# Patient Record
Sex: Female | Born: 1940 | Race: White | Hispanic: No | Smoking: Former smoker
Health system: Southern US, Community
[De-identification: ages and names within clinical notes are randomized; demographics above are authoritative.]

## PROBLEM LIST (undated history)

## (undated) DIAGNOSIS — E079 Disorder of thyroid, unspecified: Secondary | ICD-10-CM

## (undated) DIAGNOSIS — E785 Hyperlipidemia, unspecified: Secondary | ICD-10-CM

## (undated) DIAGNOSIS — I10 Essential (primary) hypertension: Secondary | ICD-10-CM

## (undated) HISTORY — DX: Hyperlipidemia, unspecified: E78.5

## (undated) HISTORY — DX: Disorder of thyroid, unspecified: E07.9

## (undated) HISTORY — PX: EYE SURGERY: SHX253

## (undated) HISTORY — DX: Essential (primary) hypertension: I10

## (undated) HISTORY — PX: APPENDECTOMY: SHX54

---

## 2013-09-12 DIAGNOSIS — M722 Plantar fascial fibromatosis: Secondary | ICD-10-CM | POA: Diagnosis not present

## 2013-09-12 DIAGNOSIS — I10 Essential (primary) hypertension: Secondary | ICD-10-CM | POA: Diagnosis not present

## 2013-09-12 DIAGNOSIS — E039 Hypothyroidism, unspecified: Secondary | ICD-10-CM | POA: Diagnosis not present

## 2013-09-12 DIAGNOSIS — I6529 Occlusion and stenosis of unspecified carotid artery: Secondary | ICD-10-CM | POA: Diagnosis not present

## 2013-09-15 DIAGNOSIS — I658 Occlusion and stenosis of other precerebral arteries: Secondary | ICD-10-CM | POA: Diagnosis not present

## 2013-09-15 DIAGNOSIS — I771 Stricture of artery: Secondary | ICD-10-CM | POA: Diagnosis not present

## 2013-09-15 DIAGNOSIS — I6529 Occlusion and stenosis of unspecified carotid artery: Secondary | ICD-10-CM | POA: Diagnosis not present

## 2013-09-23 DIAGNOSIS — H401290 Low-tension glaucoma, unspecified eye, stage unspecified: Secondary | ICD-10-CM | POA: Diagnosis not present

## 2013-09-23 DIAGNOSIS — H409 Unspecified glaucoma: Secondary | ICD-10-CM | POA: Diagnosis not present

## 2013-10-14 DIAGNOSIS — H35349 Macular cyst, hole, or pseudohole, unspecified eye: Secondary | ICD-10-CM | POA: Diagnosis not present

## 2013-10-14 DIAGNOSIS — H353290 Exudative age-related macular degeneration, unspecified eye, stage unspecified: Secondary | ICD-10-CM | POA: Diagnosis not present

## 2013-10-25 DIAGNOSIS — H353290 Exudative age-related macular degeneration, unspecified eye, stage unspecified: Secondary | ICD-10-CM | POA: Diagnosis not present

## 2013-10-28 DIAGNOSIS — H353290 Exudative age-related macular degeneration, unspecified eye, stage unspecified: Secondary | ICD-10-CM | POA: Diagnosis not present

## 2013-11-22 DIAGNOSIS — H353290 Exudative age-related macular degeneration, unspecified eye, stage unspecified: Secondary | ICD-10-CM | POA: Diagnosis not present

## 2013-11-25 DIAGNOSIS — H353290 Exudative age-related macular degeneration, unspecified eye, stage unspecified: Secondary | ICD-10-CM | POA: Diagnosis not present

## 2013-12-20 DIAGNOSIS — H353290 Exudative age-related macular degeneration, unspecified eye, stage unspecified: Secondary | ICD-10-CM | POA: Diagnosis not present

## 2013-12-23 DIAGNOSIS — H353290 Exudative age-related macular degeneration, unspecified eye, stage unspecified: Secondary | ICD-10-CM | POA: Diagnosis not present

## 2014-01-16 DIAGNOSIS — H401290 Low-tension glaucoma, unspecified eye, stage unspecified: Secondary | ICD-10-CM | POA: Diagnosis not present

## 2014-01-16 DIAGNOSIS — H409 Unspecified glaucoma: Secondary | ICD-10-CM | POA: Diagnosis not present

## 2014-01-20 DIAGNOSIS — H353290 Exudative age-related macular degeneration, unspecified eye, stage unspecified: Secondary | ICD-10-CM | POA: Diagnosis not present

## 2014-01-25 DIAGNOSIS — H353290 Exudative age-related macular degeneration, unspecified eye, stage unspecified: Secondary | ICD-10-CM | POA: Diagnosis not present

## 2014-03-03 DIAGNOSIS — J069 Acute upper respiratory infection, unspecified: Secondary | ICD-10-CM | POA: Diagnosis not present

## 2014-03-03 DIAGNOSIS — B9789 Other viral agents as the cause of diseases classified elsewhere: Secondary | ICD-10-CM | POA: Diagnosis not present

## 2014-03-03 DIAGNOSIS — H353290 Exudative age-related macular degeneration, unspecified eye, stage unspecified: Secondary | ICD-10-CM | POA: Diagnosis not present

## 2014-03-06 DIAGNOSIS — H353290 Exudative age-related macular degeneration, unspecified eye, stage unspecified: Secondary | ICD-10-CM | POA: Diagnosis not present

## 2014-03-06 DIAGNOSIS — Z1231 Encounter for screening mammogram for malignant neoplasm of breast: Secondary | ICD-10-CM | POA: Diagnosis not present

## 2014-03-20 DIAGNOSIS — I10 Essential (primary) hypertension: Secondary | ICD-10-CM | POA: Diagnosis not present

## 2014-03-20 DIAGNOSIS — E785 Hyperlipidemia, unspecified: Secondary | ICD-10-CM | POA: Diagnosis not present

## 2014-03-20 DIAGNOSIS — E039 Hypothyroidism, unspecified: Secondary | ICD-10-CM | POA: Diagnosis not present

## 2014-03-20 DIAGNOSIS — J069 Acute upper respiratory infection, unspecified: Secondary | ICD-10-CM | POA: Diagnosis not present

## 2014-04-17 DIAGNOSIS — F4323 Adjustment disorder with mixed anxiety and depressed mood: Secondary | ICD-10-CM | POA: Diagnosis not present

## 2014-05-09 DIAGNOSIS — D239 Other benign neoplasm of skin, unspecified: Secondary | ICD-10-CM | POA: Diagnosis not present

## 2014-05-09 DIAGNOSIS — I789 Disease of capillaries, unspecified: Secondary | ICD-10-CM | POA: Diagnosis not present

## 2014-05-09 DIAGNOSIS — L820 Inflamed seborrheic keratosis: Secondary | ICD-10-CM | POA: Diagnosis not present

## 2014-05-09 DIAGNOSIS — L821 Other seborrheic keratosis: Secondary | ICD-10-CM | POA: Diagnosis not present

## 2014-06-12 DIAGNOSIS — Z23 Encounter for immunization: Secondary | ICD-10-CM | POA: Diagnosis not present

## 2014-06-16 DIAGNOSIS — H3532 Exudative age-related macular degeneration: Secondary | ICD-10-CM | POA: Diagnosis not present

## 2014-07-14 DIAGNOSIS — H401231 Low-tension glaucoma, bilateral, mild stage: Secondary | ICD-10-CM | POA: Diagnosis not present

## 2014-09-15 DIAGNOSIS — H3532 Exudative age-related macular degeneration: Secondary | ICD-10-CM | POA: Diagnosis not present

## 2014-09-18 DIAGNOSIS — H401232 Low-tension glaucoma, bilateral, moderate stage: Secondary | ICD-10-CM | POA: Diagnosis not present

## 2014-09-22 DIAGNOSIS — H35353 Cystoid macular degeneration, bilateral: Secondary | ICD-10-CM | POA: Diagnosis not present

## 2014-09-28 DIAGNOSIS — S33120A Subluxation of L2/L3 lumbar vertebra, initial encounter: Secondary | ICD-10-CM | POA: Diagnosis not present

## 2014-09-28 DIAGNOSIS — S33130A Subluxation of L3/L4 lumbar vertebra, initial encounter: Secondary | ICD-10-CM | POA: Diagnosis not present

## 2014-09-28 DIAGNOSIS — M5136 Other intervertebral disc degeneration, lumbar region: Secondary | ICD-10-CM | POA: Diagnosis not present

## 2014-09-28 DIAGNOSIS — M1711 Unilateral primary osteoarthritis, right knee: Secondary | ICD-10-CM | POA: Diagnosis not present

## 2014-09-28 DIAGNOSIS — M47816 Spondylosis without myelopathy or radiculopathy, lumbar region: Secondary | ICD-10-CM | POA: Diagnosis not present

## 2014-09-28 DIAGNOSIS — I10 Essential (primary) hypertension: Secondary | ICD-10-CM | POA: Diagnosis not present

## 2014-09-28 DIAGNOSIS — M549 Dorsalgia, unspecified: Secondary | ICD-10-CM | POA: Diagnosis not present

## 2014-09-28 DIAGNOSIS — S335XXA Sprain of ligaments of lumbar spine, initial encounter: Secondary | ICD-10-CM | POA: Diagnosis not present

## 2014-09-28 DIAGNOSIS — E785 Hyperlipidemia, unspecified: Secondary | ICD-10-CM | POA: Diagnosis not present

## 2014-09-28 DIAGNOSIS — E039 Hypothyroidism, unspecified: Secondary | ICD-10-CM | POA: Diagnosis not present

## 2014-09-28 DIAGNOSIS — M47896 Other spondylosis, lumbar region: Secondary | ICD-10-CM | POA: Diagnosis not present

## 2014-09-28 DIAGNOSIS — M25561 Pain in right knee: Secondary | ICD-10-CM | POA: Diagnosis not present

## 2014-10-12 DIAGNOSIS — S335XXD Sprain of ligaments of lumbar spine, subsequent encounter: Secondary | ICD-10-CM | POA: Diagnosis not present

## 2014-10-17 DIAGNOSIS — S335XXD Sprain of ligaments of lumbar spine, subsequent encounter: Secondary | ICD-10-CM | POA: Diagnosis not present

## 2014-10-18 DIAGNOSIS — M170 Bilateral primary osteoarthritis of knee: Secondary | ICD-10-CM | POA: Diagnosis not present

## 2014-10-20 DIAGNOSIS — S335XXD Sprain of ligaments of lumbar spine, subsequent encounter: Secondary | ICD-10-CM | POA: Diagnosis not present

## 2014-10-27 DIAGNOSIS — S335XXD Sprain of ligaments of lumbar spine, subsequent encounter: Secondary | ICD-10-CM | POA: Diagnosis not present

## 2014-10-30 DIAGNOSIS — M170 Bilateral primary osteoarthritis of knee: Secondary | ICD-10-CM | POA: Diagnosis not present

## 2014-10-31 DIAGNOSIS — S335XXD Sprain of ligaments of lumbar spine, subsequent encounter: Secondary | ICD-10-CM | POA: Diagnosis not present

## 2014-11-03 DIAGNOSIS — S335XXD Sprain of ligaments of lumbar spine, subsequent encounter: Secondary | ICD-10-CM | POA: Diagnosis not present

## 2014-11-06 DIAGNOSIS — M170 Bilateral primary osteoarthritis of knee: Secondary | ICD-10-CM | POA: Diagnosis not present

## 2014-11-07 DIAGNOSIS — S335XXD Sprain of ligaments of lumbar spine, subsequent encounter: Secondary | ICD-10-CM | POA: Diagnosis not present

## 2014-11-13 DIAGNOSIS — M170 Bilateral primary osteoarthritis of knee: Secondary | ICD-10-CM | POA: Diagnosis not present

## 2014-11-17 DIAGNOSIS — H401231 Low-tension glaucoma, bilateral, mild stage: Secondary | ICD-10-CM | POA: Diagnosis not present

## 2014-11-20 DIAGNOSIS — M170 Bilateral primary osteoarthritis of knee: Secondary | ICD-10-CM | POA: Diagnosis not present

## 2014-11-27 DIAGNOSIS — M170 Bilateral primary osteoarthritis of knee: Secondary | ICD-10-CM | POA: Diagnosis not present

## 2014-12-01 DIAGNOSIS — H35341 Macular cyst, hole, or pseudohole, right eye: Secondary | ICD-10-CM | POA: Diagnosis not present

## 2014-12-04 DIAGNOSIS — M170 Bilateral primary osteoarthritis of knee: Secondary | ICD-10-CM | POA: Diagnosis not present

## 2015-01-22 DIAGNOSIS — M25552 Pain in left hip: Secondary | ICD-10-CM | POA: Diagnosis not present

## 2015-01-31 DIAGNOSIS — E780 Pure hypercholesterolemia: Secondary | ICD-10-CM | POA: Diagnosis not present

## 2015-01-31 DIAGNOSIS — M25552 Pain in left hip: Secondary | ICD-10-CM | POA: Diagnosis not present

## 2015-01-31 DIAGNOSIS — E039 Hypothyroidism, unspecified: Secondary | ICD-10-CM | POA: Diagnosis not present

## 2015-01-31 DIAGNOSIS — I10 Essential (primary) hypertension: Secondary | ICD-10-CM | POA: Diagnosis not present

## 2015-01-31 DIAGNOSIS — K219 Gastro-esophageal reflux disease without esophagitis: Secondary | ICD-10-CM | POA: Diagnosis not present

## 2015-02-02 DIAGNOSIS — H35351 Cystoid macular degeneration, right eye: Secondary | ICD-10-CM | POA: Diagnosis not present

## 2015-02-02 DIAGNOSIS — H3532 Exudative age-related macular degeneration: Secondary | ICD-10-CM | POA: Diagnosis not present

## 2015-03-08 DIAGNOSIS — Z1231 Encounter for screening mammogram for malignant neoplasm of breast: Secondary | ICD-10-CM | POA: Diagnosis not present

## 2015-04-10 DIAGNOSIS — H3530 Unspecified macular degeneration: Secondary | ICD-10-CM | POA: Diagnosis not present

## 2015-04-10 DIAGNOSIS — H2513 Age-related nuclear cataract, bilateral: Secondary | ICD-10-CM | POA: Diagnosis not present

## 2015-04-10 DIAGNOSIS — H4010X2 Unspecified open-angle glaucoma, moderate stage: Secondary | ICD-10-CM | POA: Diagnosis not present

## 2015-05-18 DIAGNOSIS — H35351 Cystoid macular degeneration, right eye: Secondary | ICD-10-CM | POA: Diagnosis not present

## 2015-05-18 DIAGNOSIS — H43813 Vitreous degeneration, bilateral: Secondary | ICD-10-CM | POA: Diagnosis not present

## 2015-05-18 DIAGNOSIS — H35371 Puckering of macula, right eye: Secondary | ICD-10-CM | POA: Diagnosis not present

## 2015-05-18 DIAGNOSIS — H3531 Nonexudative age-related macular degeneration: Secondary | ICD-10-CM | POA: Diagnosis not present

## 2015-06-12 DIAGNOSIS — H3530 Unspecified macular degeneration: Secondary | ICD-10-CM | POA: Diagnosis not present

## 2015-06-12 DIAGNOSIS — E039 Hypothyroidism, unspecified: Secondary | ICD-10-CM | POA: Diagnosis not present

## 2015-06-12 DIAGNOSIS — H409 Unspecified glaucoma: Secondary | ICD-10-CM | POA: Diagnosis not present

## 2015-06-12 DIAGNOSIS — E785 Hyperlipidemia, unspecified: Secondary | ICD-10-CM | POA: Diagnosis not present

## 2015-06-12 DIAGNOSIS — I10 Essential (primary) hypertension: Secondary | ICD-10-CM | POA: Diagnosis not present

## 2015-07-12 DIAGNOSIS — H4010X2 Unspecified open-angle glaucoma, moderate stage: Secondary | ICD-10-CM | POA: Diagnosis not present

## 2015-08-16 DIAGNOSIS — H35371 Puckering of macula, right eye: Secondary | ICD-10-CM | POA: Diagnosis not present

## 2015-08-16 DIAGNOSIS — H353132 Nonexudative age-related macular degeneration, bilateral, intermediate dry stage: Secondary | ICD-10-CM | POA: Diagnosis not present

## 2015-08-16 DIAGNOSIS — H35351 Cystoid macular degeneration, right eye: Secondary | ICD-10-CM | POA: Diagnosis not present

## 2015-10-05 DIAGNOSIS — I10 Essential (primary) hypertension: Secondary | ICD-10-CM | POA: Diagnosis not present

## 2015-10-05 DIAGNOSIS — E785 Hyperlipidemia, unspecified: Secondary | ICD-10-CM | POA: Diagnosis not present

## 2015-10-05 DIAGNOSIS — I6529 Occlusion and stenosis of unspecified carotid artery: Secondary | ICD-10-CM | POA: Diagnosis not present

## 2015-10-05 DIAGNOSIS — E039 Hypothyroidism, unspecified: Secondary | ICD-10-CM | POA: Diagnosis not present

## 2015-10-05 DIAGNOSIS — Z0000 Encounter for general adult medical examination without abnormal findings: Secondary | ICD-10-CM | POA: Diagnosis not present

## 2015-10-05 DIAGNOSIS — M25559 Pain in unspecified hip: Secondary | ICD-10-CM | POA: Diagnosis not present

## 2015-10-05 DIAGNOSIS — M25569 Pain in unspecified knee: Secondary | ICD-10-CM | POA: Diagnosis not present

## 2015-10-05 DIAGNOSIS — N951 Menopausal and female climacteric states: Secondary | ICD-10-CM | POA: Diagnosis not present

## 2015-10-10 ENCOUNTER — Other Ambulatory Visit: Payer: Self-pay | Admitting: Family Medicine

## 2015-10-10 DIAGNOSIS — R42 Dizziness and giddiness: Secondary | ICD-10-CM

## 2015-10-10 DIAGNOSIS — I6523 Occlusion and stenosis of bilateral carotid arteries: Secondary | ICD-10-CM

## 2015-10-11 ENCOUNTER — Ambulatory Visit: Payer: Medicare Other | Attending: Family Medicine

## 2015-10-11 DIAGNOSIS — M25562 Pain in left knee: Secondary | ICD-10-CM | POA: Diagnosis not present

## 2015-10-11 DIAGNOSIS — R29898 Other symptoms and signs involving the musculoskeletal system: Secondary | ICD-10-CM

## 2015-10-11 DIAGNOSIS — M25552 Pain in left hip: Secondary | ICD-10-CM | POA: Diagnosis not present

## 2015-10-11 DIAGNOSIS — M25561 Pain in right knee: Secondary | ICD-10-CM | POA: Diagnosis not present

## 2015-10-11 DIAGNOSIS — R269 Unspecified abnormalities of gait and mobility: Secondary | ICD-10-CM | POA: Diagnosis not present

## 2015-10-11 DIAGNOSIS — M25652 Stiffness of left hip, not elsewhere classified: Secondary | ICD-10-CM | POA: Diagnosis not present

## 2015-10-11 NOTE — Therapy (Signed)
[facility] [address] [place], [place], [address] Phone: [phone]   Fax:  [phone]  Physical Therapy Evaluation  Patient Details  Name: Carmen Todd MRN: [id number] Date of Birth: 12/20/1940 Referring Provider: London Pepper, MD  Encounter Date: 10/11/2015      PT End of Session - 10/11/15 1059    Visit Number 1   Number of Visits 10   Date for PT Re-Evaluation 12/06/15   PT Start Time 1017   PT Stop Time 1058   PT Time Calculation (min) 41 min   Activity Tolerance Patient tolerated treatment well   Behavior During Therapy [facility] for tasks assessed/performed      Past Medical History  Diagnosis Date  . Thyroid disease   . Hypertension   . Hyperlipidemia     Past Surgical History  Procedure Laterality Date  . Eye surgery    . Appendectomy      There were no vitals filed for this visit.  Visit Diagnosis:  Hip pain, acute, left - Plan: PT plan of care cert/re-cert  Abnormality of gait - Plan: PT plan of care cert/re-cert  Hip stiffness, left - Plan: PT plan of care cert/re-cert  Arthralgia of both knees - Plan: PT plan of care cert/re-cert  Weakness of both legs - Plan: PT plan of care cert/re-cert      Subjective Assessment - 10/11/15 1024    Subjective Pt presents to PT with 3 week history of Lt lateral hip pain athe the greater trochanter.  Pt reports reports that she had a flare-up of greater trochanter bursitis 3 years ago.  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  Pt has had injections into bil. knees.   Pertinent History scoliosis, chronic LBP, chronic bil. knee pain   Limitations Walking   How long can you walk comfortably? limited to 15 minutes   Diagnostic tests none   Patient Stated Goals reduce hip pain, walk longer, perform vacuum.  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 Yes   Pain Score 7    Pain  Location Hip   Pain Orientation Left;Lateral   Pain Descriptors / Indicators Numbness;Burning;Aching   Pain Type Acute pain   Pain Onset 1 to 4 weeks ago   Pain Frequency Constant   Aggravating Factors  walking, bending over   Pain Relieving Factors pressing on greater trochanter, sitting/supine, Tylenol   Multiple Pain Sites Yes   Pain Score 4   Pain Location Knee   Pain Orientation Right;Left   Pain Type Chronic pain   Pain Onset More than a month ago   Pain Frequency Intermittent   Aggravating Factors  activity, movement, early morning   Pain Relieving Factors getting up and moving during the day            [facility] PT Assessment - 10/11/15 0001    Assessment   Medical Diagnosis knee pain, Lt hip pain   Referring Provider London Pepper, MD   Onset Date/Surgical Date 09/20/15   Next MD Visit 03/2016   Prior Therapy none   Precautions   Precautions None   Restrictions   Weight Bearing Restrictions No   Balance Screen   Has the patient fallen in the past 6 months No   Has the patient had a decrease in activity level because of a fear of falling?  No   Is the  patient reluctant to leave their home because of a fear of falling?  No   Home Ecologist residence   Living Arrangements Spouse/significant other   Type of [place] to enter   Entrance Stairs-Number of Steps 3   Prior Function   Level of [place] Retired   Leisure likes to walk but cant due to hip pain   Cognition   Overall Cognitive Status Within Functional Limits for tasks assessed   Observation/Other Assessments   Focus on Therapeutic Outcomes (FOTO)  53% limitation   Posture/Postural Control   Posture/Postural Control Postural limitations   Postural Limitations Forward head;Weight shift right  Lt hip higher in standing due to scoliosis   Posture Comments Rt thoracic convex curve   ROM / Strength   AROM / PROM / Strength  AROM;PROM;Strength   AROM   Overall AROM  Deficits   Overall AROM Comments Lt hip AROM limited by 50% into ER due to pain and stiffness, Rt limited by 25%, hamstring length normal   PROM   Overall PROM  Deficits   Overall PROM Comments Lt hip PROM limited by 25% vs the Rt due to pain at Lt greater trochanter   Strength   Overall Strength Deficits;Due to pain   Overall Strength Comments Rt hip and knee 4+/5, Lt hip 4/5 with pain upon abduction and flexion, knee 4+/5   Palpation   Palpation comment diffuse palpable tenderness over bilateral knee joints, distal hamtrings.  Marked palpable tenderness over Lt greater trochanter, glut med and glut min, and proximal ITB with active trigger points   Ambulation/Gait   Ambulation/Gait Yes   Ambulation/Gait Assistance 6: Modified independent (Device/Increase time)   Gait Pattern Antalgic;Trunk flexed;Decreased trunk rotation;Right flexed knee in stance;Left flexed knee in stance;Decreased weight shift to left;Decreased stance time - left                           PT Education - 10/11/15 1052    Education provided Yes   Education Details HEP: stretching   Person(s) Educated Patient   Methods Explanation;Demonstration;Handout   Comprehension Verbalized understanding;Returned demonstration          PT Short Term Goals - 10/11/15 1112    PT SHORT TERM GOAL #1   Title be independent in initial HEP   Time 4   Period Weeks   Status New   PT SHORT TERM GOAL #2   Title report at 30% reduction in Lt hip pain with standing and walking   Time 4   Period Weeks   Status New   PT SHORT TERM GOAL #3   Title reduce hip and knee pain to walk for 20-25 minutes without need to rest to allow for return to walking for exercise   Time 4   Period Weeks   Status New   PT SHORT TERM GOAL #4   Title report < or = to 4/10 Lt hip pain with negotiating steps   Time 4   Period Weeks   Status New           PT Long Term Goals -  10/11/15 1016    PT LONG TERM GOAL #1   Title be independent in advanced HEP   Time 8   Period Weeks   Status New   PT LONG TERM GOAL #2   Title reduce FOTO to < or =  to 43% limitation   Time 8   Period Weeks   Status New   PT LONG TERM GOAL #3   Title improve Lt hip ER AROM to cross leg to put on shoes without limitation   Time 8   Period Weeks   Status New   PT LONG TERM GOAL #4   Title report a 60% reduction in Lt hip pain with standing and walking   Time 8   Period Weeks   Status New   PT LONG TERM GOAL #5   Title demonstrate symmetry and neutral trunk posture with ambulating on level surface   Time 8   Period Weeks   Status New               Plan - 10/22/2015 1059    Clinical Impression Statement Pt presents to PT with 3 week history of Lt lateral hip pain at greater trochanter without incident and chronic history of bil, knee pain due to OA.  Pt demonstrates limited Lt hip flexibility, weakness upon testing with pain, thoracic scoliosis with pelvic assymmetry.  Pt limited to walking 15 minutes and demonstrates antalgia.  Pt will beneift from skilled PT for manual, ionto/US to Lt lateral leg and gluteals, flexibility and strength.     Pt will benefit from skilled therapeutic intervention in order to improve on the following deficits Pain;Postural dysfunction;Decreased strength;Impaired flexibility;Improper body mechanics;Decreased activity tolerance;Abnormal gait;Decreased range of motion;Difficulty walking;Increased muscle spasms   Rehab Potential Good   PT Frequency 2x / week   PT Duration 8 weeks   PT Treatment/Interventions ADLs/Self Care Home Management;Cryotherapy;Electrical Stimulation;Iontophoresis 4mg /ml Dexamethasone;Moist Heat;Therapeutic exercise;Therapeutic activities;Functional mobility training;Stair training;Gait training;Ultrasound;Neuromuscular re-education;Manual techniques;Taping;Dry needling;Passive range of motion   PT Next Visit Plan [place], manual to  Lt lateral hip, gluteals and ITB, gentle flexibility, ionto if MD signs order, Dry needling in 1-2 weeks   Consulted and Agree with Plan of Care Patient          G-Codes - 10-22-2015 1016    Functional Assessment Tool Used FOTO: 53% limitation   Functional Limitation Mobility: Walking and moving around   Mobility: Walking and Moving Around Current Status [id number]) At least 40 percent but less than 60 percent impaired, limited or restricted   Mobility: Walking and Moving Around Goal Status [id number]) At least 40 percent but less than 60 percent impaired, limited or restricted       Problem List There are no active problems to display for this patient.   Emmerich Cryer, PT 22-Oct-2015, 11:23 AM  [facility] [address] [place], [place], [address] Phone: [phone]   Fax:  [phone]  Name: Carmen Todd MRN: [id number] Date of Birth: January 05, 1941

## 2015-10-11 NOTE — Patient Instructions (Signed)
Piriformis Stretch - Supine    Pull uninvolved knee across body toward opposite shoulder. Hold slight stretch for _20__ seconds. Repeat with involved leg. Repeat _3__ times. Do _3__ times per day.  Copyright  VHI. All rights reserved.   Knee to Chest    Lying supine, bend involved knee to chest.  Hold 20 seconds, repeat  _3__ times. Repeat with other leg. Do _3__ times per day.  Copyright  VHI. All rights reserved.   Quadratus Stretch    Sit on a chair with feet on the floor.  Bend to the right.  Hold 10-20 seconds Repeat __3__ times per set. Do ____ sets per session. Do _3___ sessions per day. To increase stretch, raise other arm above head  http://orth.exer.us/291   Copyright  VHI. All rights reserved.  [place] [address], [place], [place] [address] Phone # [phone] Fax [phone]

## 2015-10-16 ENCOUNTER — Ambulatory Visit: Payer: Medicare Other

## 2015-10-16 DIAGNOSIS — M25552 Pain in left hip: Secondary | ICD-10-CM | POA: Diagnosis not present

## 2015-10-16 DIAGNOSIS — R29898 Other symptoms and signs involving the musculoskeletal system: Secondary | ICD-10-CM

## 2015-10-16 DIAGNOSIS — M25561 Pain in right knee: Secondary | ICD-10-CM

## 2015-10-16 DIAGNOSIS — M25652 Stiffness of left hip, not elsewhere classified: Secondary | ICD-10-CM | POA: Diagnosis not present

## 2015-10-16 DIAGNOSIS — R269 Unspecified abnormalities of gait and mobility: Secondary | ICD-10-CM | POA: Diagnosis not present

## 2015-10-16 DIAGNOSIS — M25562 Pain in left knee: Secondary | ICD-10-CM

## 2015-10-16 NOTE — Patient Instructions (Signed)

## 2015-10-16 NOTE — Therapy (Signed)
[facility] [address], [place] [address], [place], [address] Phone: [phone]   Fax:  [phone]  Physical Therapy Treatment  Patient Details  Name: Carmen Todd MRN: [id number] Date of Birth: [birth year] Referring Provider: London Pepper, MD  Encounter Date: 10/16/2015      PT End of Session - 10/16/15 1523    Visit Number 2   Number of Visits 10   Date for PT Re-Evaluation 12/06/15   PT Start Time [id number]   PT Stop Time 1524   PT Time Calculation (min) 40 min   Activity Tolerance Patient tolerated treatment well   Behavior During Therapy [facility] for tasks assessed/performed      Past Medical History  Diagnosis Date  . Thyroid disease   . Hypertension   . Hyperlipidemia     Past Surgical History  Procedure Laterality Date  . Eye surgery    . Appendectomy      There were no vitals filed for this visit.  Visit Diagnosis:  Hip pain, acute, left  Abnormality of gait  Hip stiffness, left  Arthralgia of both knees  Weakness of both legs      Subjective Assessment - 10/16/15 1445    Currently in Pain? Yes   Pain Score 6    Pain Location Hip   Pain Orientation Left;Lateral   Pain Descriptors / Indicators Aching;Burning;Numbness   Pain Type Acute pain   Pain Onset More than a month ago   Pain Frequency Constant   Aggravating Factors  walking, bending over   Pain Relieving Factors pressing on greater trochanter, Tylenol                         [facility] Adult PT Treatment/Exercise - 10/16/15 0001    Exercises   Exercises Knee/Hip;Lumbar   Lumbar Exercises: Stretches   Single Knee to Chest Stretch 3 reps;20 seconds   Lower Trunk Rotation 3 reps;20 seconds   Piriformis Stretch 3 reps;20 seconds  diagonal knee to chest   Lumbar Exercises: Seated   Other Seated Lumbar Exercises seated quadratus stretch 3x20 seconds   Modalities   Modalities Iontophoresis;Ultrasound   Ultrasound    Ultrasound Location Lt lateral hip at greater trochanter, glut med and proximal ITB   Ultrasound Parameters 1.0 w/cm2 50% pulsed x 8 minutes   Ultrasound Goals Pain   Iontophoresis   Type of Iontophoresis Dexamethasone   Location Lt greater trochanter   Dose 1.0 cc  #1   Time 6 hour wear   Manual Therapy   Manual Therapy Soft tissue mobilization   Manual therapy comments gentle soft tissue using orange roller over gluteals and proximal ITB                PT Education - 10/16/15 1457    Education provided Yes   Education Details ionto pt instruction   Person(s) Educated Patient   Methods Explanation;Demonstration;Handout   Comprehension Verbalized understanding;Returned demonstration          PT Short Term Goals - 10/16/15 1446    PT SHORT TERM GOAL #1   Title be independent in initial HEP   Time 4   Period Weeks   Status On-going   PT SHORT TERM GOAL #2   Title report at 30% reduction in Lt hip pain with standing and walking   Time 4   Period Weeks   Status On-going   PT SHORT TERM GOAL #3   Title reduce  hip and knee pain to walk for 20-25 minutes without need to rest to allow for return to walking for exercise   Time 4   Period Weeks   Status On-going           PT Long Term Goals - 10/11/15 1016    PT LONG TERM GOAL #1   Title be independent in advanced HEP   Time 8   Period Weeks   Status New   PT LONG TERM GOAL #2   Title reduce FOTO to < or = to 43% limitation   Time 8   Period Weeks   Status New   PT LONG TERM GOAL #3   Title improve Lt hip ER AROM to cross leg to put on shoes without limitation   Time 8   Period Weeks   Status New   PT LONG TERM GOAL #4   Title report a 60% reduction in Lt hip pain with standing and walking   Time 8   Period Weeks   Status New   PT LONG TERM GOAL #5   Title demonstrate symmetry and neutral trunk posture with ambulating on level surface   Time 8   Period Weeks   Status New                Plan - 10/16/15 1448    Clinical Impression Statement Pt with only 1 session after evaluation.  Pt is able to demonstrate initial HEP correctly and reports compliance.  Pt with significant tenderness over Lt lateral hip/greater trochanter and ITB but this is improved from evaluation.  Pt with Lt hip stiffness and weakness and limited endurance for walking with mild antalgia on level surface.  Pt will benefit from skilled PT for manual, [place], ionto and dry needling (in 2 weeks) to reduce pain and improve function.     Pt will benefit from skilled therapeutic intervention in order to improve on the following deficits Pain;Postural dysfunction;Decreased strength;Impaired flexibility;Improper body mechanics;Decreased activity tolerance;Abnormal gait;Decreased range of motion;Difficulty walking;Increased muscle spasms   Rehab Potential Good   PT Frequency 2x / week   PT Duration 8 weeks   PT Treatment/Interventions ADLs/Self Care Home Management;Cryotherapy;Electrical Stimulation;Iontophoresis 4mg /ml Dexamethasone;Moist Heat;Therapeutic exercise;Therapeutic activities;Functional mobility training;Stair training;Gait training;Ultrasound;Neuromuscular re-education;Manual techniques;Taping;Dry needling;Passive range of motion   PT Next Visit Plan [place], manual to Lt lateral hip, gluteals and ITB, gentle flexibility, ionto, Dry needling in 1-2 weeks   Consulted and Agree with Plan of Care Patient        Problem List There are no active problems to display for this patient.   Carmen Todd, PT 10/16/2015, 3:25 PM  [facility] [address], [place], [place], [address] Phone: [phone]   Fax:  [phone]  Name: Carmen Todd MRN: [id number] Date of Birth: 1941-06-26

## 2015-10-17 ENCOUNTER — Ambulatory Visit
Admission: RE | Admit: 2015-10-17 | Discharge: 2015-10-17 | Disposition: A | Discharge status: Home / Self Care | Payer: Medicare Other | Source: Ambulatory Visit | Attending: Family Medicine | Admitting: Family Medicine

## 2015-10-17 DIAGNOSIS — I6523 Occlusion and stenosis of bilateral carotid arteries: Secondary | ICD-10-CM

## 2015-10-17 DIAGNOSIS — R42 Dizziness and giddiness: Secondary | ICD-10-CM

## 2015-10-18 ENCOUNTER — Ambulatory Visit: Payer: Medicare Other | Admitting: Physical Therapy

## 2015-10-18 ENCOUNTER — Encounter: Payer: Self-pay | Admitting: Physical Therapy

## 2015-10-18 DIAGNOSIS — R269 Unspecified abnormalities of gait and mobility: Secondary | ICD-10-CM | POA: Diagnosis not present

## 2015-10-18 DIAGNOSIS — R29898 Other symptoms and signs involving the musculoskeletal system: Secondary | ICD-10-CM | POA: Diagnosis not present

## 2015-10-18 DIAGNOSIS — M25652 Stiffness of left hip, not elsewhere classified: Secondary | ICD-10-CM

## 2015-10-18 DIAGNOSIS — M25552 Pain in left hip: Secondary | ICD-10-CM

## 2015-10-18 DIAGNOSIS — M25562 Pain in left knee: Secondary | ICD-10-CM

## 2015-10-18 DIAGNOSIS — H401132 Primary open-angle glaucoma, bilateral, moderate stage: Secondary | ICD-10-CM | POA: Diagnosis not present

## 2015-10-18 DIAGNOSIS — M25561 Pain in right knee: Secondary | ICD-10-CM | POA: Diagnosis not present

## 2015-10-18 NOTE — Therapy (Signed)
[facility] [address] [place], [place], [address] Phone: [phone]   Fax:  [phone]  Physical Therapy Treatment  Patient Details  Name: Carmen Todd MRN: [id number] Date of Birth: [birth year] Referring Provider: London Pepper, MD  Encounter Date: 10/18/2015      PT End of Session - 10/18/15 1422    Visit Number 3   Number of Visits 10   Date for PT Re-Evaluation 12/06/15   PT Start Time [id number]   PT Stop Time 1443   PT Time Calculation (min) 45 min   Activity Tolerance Patient tolerated treatment well   Behavior During Therapy [facility] for tasks assessed/performed      Past Medical History  Diagnosis Date  . Thyroid disease   . Hypertension   . Hyperlipidemia     Past Surgical History  Procedure Laterality Date  . Eye surgery    . Appendectomy      There were no vitals filed for this visit.  Visit Diagnosis:  Hip pain, acute, left  Abnormality of gait  Hip stiffness, left  Arthralgia of both knees  Weakness of both legs      Subjective Assessment - 10/18/15 1414    Subjective Pt reports her pain in left hip is down to a 3-4/10 from 6-7/10. Pt continues to complain of bil knee pain and LE weakness.      Pertinent History scoliosis, chronic LBP, chronic bil. knee pain   Limitations Walking   How long can you walk comfortably? limited to 15 minutes   Diagnostic tests none   Patient Stated Goals reduce hip pain, walk longer, perform vacuum.  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 Yes   Pain Score 3    Pain Location Hip   Pain Orientation Left;Lateral   Pain Descriptors / Indicators Aching;Burning;Numbness   Pain Type Acute pain   Pain Onset More than a month ago   Aggravating Factors  walking, bending over   Pain Relieving Factors stretching, ionto patch, Tylenol   Multiple Pain Sites Yes   Pain Score 4   Pain Location  Knee   Pain Orientation Right;Left   Pain Descriptors / Indicators Aching;Burning   Pain Onset More than a month ago   Pain Frequency Intermittent   Aggravating Factors  activities, movement, early morning   Pain Relieving Factors geting up and moving around                         [facility] Adult PT Treatment/Exercise - 10/18/15 0001    Bed Mobility   Bed Mobility --  Pt wishes to be called Carmen Todd   Posture/Postural Control   Posture/Postural Control Postural limitations   Postural Limitations Forward head;Weight shift right  Lt hip higher due to scoliosis   Posture Comments Rt thoracic convex curve   Exercises   Exercises Knee/Hip;Lumbar   Lumbar Exercises: Stretches   Single Knee to Chest Stretch 3 reps;20 seconds   Lower Trunk Rotation 3 reps;20 seconds   Piriformis Stretch 3 reps;20 seconds  diagonal knee to chest   Lumbar Exercises: Aerobic   Stationary Bike L1 x 4 min, unable to perform more due to weakness and discomfort in bil knees   Lumbar Exercises: Seated   Other Seated Lumbar Exercises --   Modalities   Modalities Iontophoresis;Ultrasound   Ultrasound   Ultrasound Location Lt lateal  hip   Ultrasound Parameters 50%, 1Mhz, 1W/cm x 49min    Ultrasound Goals Pain   Iontophoresis   Type of Iontophoresis Dexamethasone   Location Lt greater trochanter   Dose 1.0 cc   Time 6 hour wear   Manual Therapy   Manual Therapy Soft tissue mobilization   Manual therapy comments gentle soft tissue using orange roller over gluteals and proximal ITB                  PT Short Term Goals - 10/16/15 1446    PT SHORT TERM GOAL #1   Title be independent in initial HEP   Time 4   Period Weeks   Status On-going   PT SHORT TERM GOAL #2   Title report at 30% reduction in Lt hip pain with standing and walking   Time 4   Period Weeks   Status On-going   PT SHORT TERM GOAL #3   Title reduce hip and knee pain to walk for 20-25 minutes without need to rest to  allow for return to walking for exercise   Time 4   Period Weeks   Status On-going           PT Long Term Goals - 10/11/15 1016    PT LONG TERM GOAL #1   Title be independent in advanced HEP   Time 8   Period Weeks   Status New   PT LONG TERM GOAL #2   Title reduce FOTO to < or = to 43% limitation   Time 8   Period Weeks   Status New   PT LONG TERM GOAL #3   Title improve Lt hip ER AROM to cross leg to put on shoes without limitation   Time 8   Period Weeks   Status New   PT LONG TERM GOAL #4   Title report a 60% reduction in Lt hip pain with standing and walking   Time 8   Period Weeks   Status New   PT LONG TERM GOAL #5   Title demonstrate symmetry and neutral trunk posture with ambulating on level surface   Time 8   Period Weeks   Status New               Plan - 10/18/15 1422    Clinical Impression Statement Pt reports her pain is less than at the beginning of PT. Pt with tenderness and TP over Lt lat hip/greater trochanter, but improving since eval. Pt was only able to ride the bike on level 1 for 4 min. Pt will continue to benefit from skilled PT for manual, [place], ionto and drr needeling to decrease pain and improve fu nction.   Pt will benefit from skilled therapeutic intervention in order to improve on the following deficits Pain;Postural dysfunction;Decreased strength;Impaired flexibility;Improper body mechanics;Decreased activity tolerance;Abnormal gait;Decreased range of motion;Difficulty walking;Increased muscle spasms   Rehab Potential Good   PT Frequency 2x / week   PT Duration 8 weeks   PT Treatment/Interventions ADLs/Self Care Home Management;Cryotherapy;Electrical Stimulation;Iontophoresis 4mg /ml Dexamethasone;Moist Heat;Therapeutic exercise;Therapeutic activities;Functional mobility training;Stair training;Gait training;Ultrasound;Neuromuscular re-education;Manual techniques;Taping;Dry needling;Passive range of motion   PT Next Visit Plan [place], manual  to Lt lateral hip, gluteals and ITB, gentle flexibility, ionto, Dry needling in 1-2 weeks   Consulted and Agree with Plan of Care Patient        Problem List There are no active problems to display for this patient.   NAUMANN-HOUEGNIFIO,Maverick Dieudonne PTA 10/18/2015, 2:39 PM  [facility] [address], [place], [place], [address] Phone: [phone]   Fax:  [phone]  Name: Jalayla Biagi MRN: [id number] Date of Birth: October 30, 1940

## 2015-10-22 ENCOUNTER — Ambulatory Visit: Payer: Medicare Other

## 2015-10-22 DIAGNOSIS — R269 Unspecified abnormalities of gait and mobility: Secondary | ICD-10-CM | POA: Diagnosis not present

## 2015-10-22 DIAGNOSIS — M25652 Stiffness of left hip, not elsewhere classified: Secondary | ICD-10-CM | POA: Diagnosis not present

## 2015-10-22 DIAGNOSIS — R29898 Other symptoms and signs involving the musculoskeletal system: Secondary | ICD-10-CM

## 2015-10-22 DIAGNOSIS — M25552 Pain in left hip: Secondary | ICD-10-CM

## 2015-10-22 DIAGNOSIS — M25561 Pain in right knee: Secondary | ICD-10-CM

## 2015-10-22 DIAGNOSIS — M25562 Pain in left knee: Secondary | ICD-10-CM

## 2015-10-22 NOTE — Patient Instructions (Signed)

## 2015-10-22 NOTE — Therapy (Signed)
[facility] [address] [place], [place], [address] Phone: [phone]   Fax:  [phone]  Physical Therapy Treatment  Patient Details  Name: Carmen Todd MRN: [id number] Date of Birth: [birth year] Referring Provider: London Pepper, MD  Encounter Date: 10/22/2015      PT End of Session - 10/22/15 1225    Visit Number 4   Number of Visits 10   Date for PT Re-Evaluation 12/06/15   PT Start Time 1146   PT Stop Time 1226   PT Time Calculation (min) 40 min   Activity Tolerance Patient tolerated treatment well   Behavior During Therapy [facility] for tasks assessed/performed      Past Medical History  Diagnosis Date  . Thyroid disease   . Hypertension   . Hyperlipidemia     Past Surgical History  Procedure Laterality Date  . Eye surgery    . Appendectomy      There were no vitals filed for this visit.  Visit Diagnosis:  Hip pain, acute, left  Abnormality of gait  Hip stiffness, left  Arthralgia of both knees  Weakness of both legs      Subjective Assessment - 10/22/15 1148    Subjective "I was in agony after last session, too much pressure with orange roller"     Pertinent History scoliosis, chronic LBP, chronic bil. knee pain   Patient Stated Goals reduce hip pain, walk longer, perform vacuum.  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 Yes   Pain Score 5   up to 8/10 over the weekend   Pain Location Hip   Pain Orientation Left   Pain Descriptors / Indicators Aching;Burning;Numbness   Pain Type Acute pain   Pain Onset More than a month ago   Pain Frequency Constant   Aggravating Factors  walking, standing   Pain Relieving Factors stretching, laying down   Pain Score 0   Pain Location Knee   Pain Orientation Right;Left   Pain Descriptors / Indicators Aching   Pain Type Chronic pain                          [facility] Adult PT Treatment/Exercise - 10/22/15 0001    Lumbar Exercises: Stretches   Active Hamstring Stretch 3 reps;20 seconds   Single Knee to Chest Stretch 3 reps;20 seconds  diagonal knee to chest   Lower Trunk Rotation 3 reps;20 seconds   Modalities   Modalities Iontophoresis;Ultrasound   Ultrasound   Ultrasound Location Lt lateral hip at greater trochanter   Ultrasound Parameters 50% pulsed 1.0 w/cm2 x 8 minutes   Ultrasound Goals Pain   Iontophoresis   Type of Iontophoresis Dexamethasone   Location Lt greater trochanter   Dose 1.0 cc  #   Time 6 hour wear   Manual Therapy   Manual Therapy Soft tissue mobilization   Manual therapy comments gentle soft tissue using orange roller over gluteals and proximal ITB                PT Education - 10/22/15 1204    Education provided Yes   Education Details ionto instructions   Person(s) Educated Patient   Methods Explanation;Demonstration;Handout   Comprehension Verbalized understanding;Returned demonstration          PT Short Term Goals - 10/22/15 1202    PT SHORT TERM GOAL #1   Title  be independent in initial HEP   Status Achieved   PT SHORT TERM GOAL #2   Title report at 30% reduction in Lt hip pain with standing and walking   Time 4   Period Weeks   Status On-going   PT SHORT TERM GOAL #3   Title reduce hip and knee pain to walk for 20-25 minutes without need to rest to allow for return to walking for exercise   Time 4   Period Weeks   Status On-going   PT SHORT TERM GOAL #4   Title report < or = to 4/10 Lt hip pain with negotiating steps   Time 4   Period Weeks   Status On-going           PT Long Term Goals - 10/11/15 1016    PT LONG TERM GOAL #1   Title be independent in advanced HEP   Time 8   Period Weeks   Status New   PT LONG TERM GOAL #2   Title reduce FOTO to < or = to 43% limitation   Time 8   Period Weeks   Status New   PT LONG TERM GOAL #3   Title improve Lt hip ER AROM to  cross leg to put on shoes without limitation   Time 8   Period Weeks   Status New   PT LONG TERM GOAL #4   Title report a 60% reduction in Lt hip pain with standing and walking   Time 8   Period Weeks   Status New   PT LONG TERM GOAL #5   Title demonstrate symmetry and neutral trunk posture with ambulating on level surface   Time 8   Period Weeks   Status New               Plan - 10/22/15 1156    Clinical Impression Statement Pt with flare-up of pain after last session and had to rest a lot over the weekend.  Pt reports that pain is now 5/10 in Lt hip.  No significant progress toward goals as there was 7/10 Lt hip hip pain over the past 3 days.  Pt felt like she was making progress prior to flare-up. Pt will benefit from skilled PT for flexibility, strength as tolerated and manual/modalities for pain.     Pt will benefit from skilled therapeutic intervention in order to improve on the following deficits Pain;Postural dysfunction;Decreased strength;Impaired flexibility;Improper body mechanics;Decreased activity tolerance;Abnormal gait;Decreased range of motion;Difficulty walking;Increased muscle spasms   Rehab Potential Good   PT Frequency 2x / week   PT Duration 8 weeks   PT Treatment/Interventions ADLs/Self Care Home Management;Cryotherapy;Electrical Stimulation;Iontophoresis 4mg /ml Dexamethasone;Moist Heat;Therapeutic exercise;Therapeutic activities;Functional mobility training;Stair training;Gait training;Ultrasound;Neuromuscular re-education;Manual techniques;Taping;Dry needling;Passive range of motion   PT Next Visit Plan [place], manual to Lt lateral hip, gluteals and ITB, gentle flexibility, ionto, Dry needling in 1-2 weeks   Consulted and Agree with Plan of Care Patient        Problem List There are no active problems to display for this patient.   TAKACS,KELLY, PT 10/22/2015, 12:28 PM  [facility] [address], [place], [place], [address] Phone: [phone]   Fax:  [phone]  Name: Edmonia Zukowski MRN: [id number] Date of Birth: [birth year]

## 2015-10-24 ENCOUNTER — Ambulatory Visit: Payer: Medicare Other

## 2015-10-24 DIAGNOSIS — M25561 Pain in right knee: Secondary | ICD-10-CM

## 2015-10-24 DIAGNOSIS — R29898 Other symptoms and signs involving the musculoskeletal system: Secondary | ICD-10-CM | POA: Diagnosis not present

## 2015-10-24 DIAGNOSIS — M25552 Pain in left hip: Secondary | ICD-10-CM | POA: Diagnosis not present

## 2015-10-24 DIAGNOSIS — M25562 Pain in left knee: Secondary | ICD-10-CM

## 2015-10-24 DIAGNOSIS — R269 Unspecified abnormalities of gait and mobility: Secondary | ICD-10-CM | POA: Diagnosis not present

## 2015-10-24 DIAGNOSIS — M25652 Stiffness of left hip, not elsewhere classified: Secondary | ICD-10-CM

## 2015-10-24 NOTE — Patient Instructions (Addendum)
Hip Flexor Stretch    Lying on back near edge of bed, bend one leg, foot flat. Hang other leg over edge, relaxed, thigh resting entirely on bed for 10 seconds. Repeat _5___ times. Do __2__ sessions per day. Advanced Exercise: Bend knee back keeping thigh in contact with bed.  http://gt2.exer.us/347   Copyright  VHI. All rights reserved.  Butterfly, Supine    Lie on back, feet together. Lower knees toward floor. Hold _10__ seconds. Repeat _5__ times per session. Do _2__ sessions per day.  IONTOPHORESIS PATIENT PRECAUTIONS & CONTRAINDICATIONS:  . Redness under one or both electrodes can occur.  This characterized by a uniform redness that usually disappears within 12 hours of treatment. . Small pinhead size blisters may result in response to the drug.  Contact your physician if the problem persists more than 24 hours. . On rare occasions, iontophoresis therapy can result in temporary skin reactions such as rash, inflammation, irritation or burns.  The skin reactions may be the result of individual sensitivity to the ionic solution used, the condition of the skin at the start of treatment, reaction to the materials in the electrodes, allergies or sensitivity to dexamethasone, or a poor connection between the patch and your skin.  Discontinue using iontophoresis if you have any of these reactions and report to your therapist. . Remove the Patch or electrodes if you have any undue sensation of pain or burning during the treatment and report discomfort to your therapist. . Tell your Therapist if you have had known adverse reactions to the application of electrical current. . If using the Patch, the LED light will turn off when treatment is complete and the patch can be removed.  Approximate treatment time is 1-3 hours.  Remove the patch when light goes off or after 6 hours. . The Patch can be worn during normal activity, however excessive motion where the electrodes have been placed can cause  poor contact between the skin and the electrode or uneven electrical current resulting in greater risk of skin irritation. Marland Kitchen Keep out of the reach of children.   . DO NOT use if you have a cardiac pacemaker or any other electrically sensitive implanted device. . DO NOT use if you have a known sensitivity to dexamethasone. . DO NOT use during Magnetic Resonance Imaging (MRI). . DO NOT use over broken or compromised skin (e.g. sunburn, cuts, or acne) due to the increased risk of skin reaction. . DO NOT SHAVE over the area to be treated:  To establish good contact between the Patch and the skin, excessive hair may be clipped. . DO NOT place the Patch or electrodes on or over your eyes, directly over your heart, or brain. DO NOT reuse the Patch or electrodes as this may cause burns to occur.  [facility] [address], [place], [place] [address] Phone # [phone] Fax [phone]

## 2015-10-24 NOTE — Therapy (Signed)
[facility] [address] [place], [place], [address] Phone: [phone]   Fax:  [phone]  Physical Therapy Treatment  Patient Details  Name: Carmen Todd MRN: [id number] Date of Birth: Jan 09, 1941 Referring Provider: London Pepper, MD  Encounter Date: 10/24/2015      PT End of Session - 10/24/15 1005    Visit Number 5   Number of Visits 10   Date for PT Re-Evaluation 12/06/15   PT Start Time 0928   PT Stop Time 1007   PT Time Calculation (min) 39 min   Activity Tolerance Patient tolerated treatment well   Behavior During Therapy [facility] for tasks assessed/performed      Past Medical History  Diagnosis Date  . Thyroid disease   . Hypertension   . Hyperlipidemia     Past Surgical History  Procedure Laterality Date  . Eye surgery    . Appendectomy      There were no vitals filed for this visit.  Visit Diagnosis:  Hip pain, acute, left  Abnormality of gait  Hip stiffness, left  Arthralgia of both knees  Weakness of both legs      Subjective Assessment - 10/24/15 0930    Subjective Not too bad  today.  Able to do stretches.   Currently in Pain? Yes   Pain Score 4    Pain Location Hip   Pain Orientation Left   Pain Descriptors / Indicators Aching;Burning;Numbness   Pain Type Acute pain   Pain Onset More than a month ago   Pain Frequency Intermittent   Aggravating Factors  walking, standing, laying on Lt side   Pain Relieving Factors stretching, laying down   Pain Score 0   Pain Location Knee   Pain Orientation Right;Left                         [facility] Adult PT Treatment/Exercise - 10/24/15 0001    Lumbar Exercises: Supine   Bridge --   Lumbar Exercises: Prone   Other Prone Lumbar Exercises prone on elbows x 3 minutes to stretch hip flexors   Knee/Hip Exercises: Stretches   Hip Flexor Stretch Left;5 reps;10 seconds   Other Knee/Hip Stretches butterfly stretch 10  seconds x 5   Modalities   Modalities Iontophoresis;Ultrasound   Ultrasound   Ultrasound Location Lt lateral hip, trochanter, ITB   Ultrasound Parameters 50% pulsed 1.0 w/cm2 x 8 minutes   Ultrasound Goals Pain   Iontophoresis   Type of Iontophoresis Dexamethasone   Location Lt greater trochanter   Dose 1.0 cc  #4   Time 6 hour wear   Manual Therapy   Manual Therapy Soft tissue mobilization   Manual therapy comments soft tissue elongation to proximal ITB and glut med and max                PT Education - 10/24/15 0940    Education provided Yes   Education Details ionto instructions, butterfly, hip flexor stretch   Person(s) Educated Patient   Methods Explanation;Demonstration;Handout   Comprehension Verbalized understanding;Returned demonstration          PT Short Term Goals - 10/22/15 1202    PT SHORT TERM GOAL #1   Title be independent in initial HEP   Status Achieved   PT SHORT TERM GOAL #2   Title report at 30% reduction in Lt hip pain with standing and walking   Time 4   Period Weeks  Status On-going   PT SHORT TERM GOAL #3   Title reduce hip and knee pain to walk for 20-25 minutes without need to rest to allow for return to walking for exercise   Time 4   Period Weeks   Status On-going   PT SHORT TERM GOAL #4   Title report < or = to 4/10 Lt hip pain with negotiating steps   Time 4   Period Weeks   Status On-going           PT Long Term Goals - 10/11/15 1016    PT LONG TERM GOAL #1   Title be independent in advanced HEP   Time 8   Period Weeks   Status New   PT LONG TERM GOAL #2   Title reduce FOTO to < or = to 43% limitation   Time 8   Period Weeks   Status New   PT LONG TERM GOAL #3   Title improve Lt hip ER AROM to cross leg to put on shoes without limitation   Time 8   Period Weeks   Status New   PT LONG TERM GOAL #4   Title report a 60% reduction in Lt hip pain with standing and walking   Time 8   Period Weeks   Status New    PT LONG TERM GOAL #5   Title demonstrate symmetry and neutral trunk posture with ambulating on level surface   Time 8   Period Weeks   Status New               Plan - 10/24/15 0940    Clinical Impression Statement Pt feeling much better today after flare-up with week.  Reduced palpable tenderness over glut med and max.  Tenderness and tension over proximal ITB with manual therapy today.  Pt with 3-4/10 Lt hip pain reported and this is now intermittent.  Pt demonstrates forward trunk flexion in standing and walking with shortened hip flexors.  Pt tolerated new stretches well today.  Pt will continue to benefit from skilled  PT for dry needling, ionto, manual, stretching and strength exercises as tolerated.     Pt will benefit from skilled therapeutic intervention in order to improve on the following deficits Pain;Postural dysfunction;Decreased strength;Impaired flexibility;Improper body mechanics;Decreased activity tolerance;Abnormal gait;Decreased range of motion;Difficulty walking;Increased muscle spasms   Rehab Potential Good   PT Frequency 2x / week   PT Duration 8 weeks   PT Treatment/Interventions ADLs/Self Care Home Management;Cryotherapy;Electrical Stimulation;Iontophoresis 4mg /ml Dexamethasone;Moist Heat;Therapeutic exercise;Therapeutic activities;Functional mobility training;Stair training;Gait training;Ultrasound;Neuromuscular re-education;Manual techniques;Taping;Dry needling;Passive range of motion   PT Next Visit Plan [place], manual to Lt lateral hip, gluteals and ITB, gentle flexibility, ionto, Dry needling next session.     Consulted and Agree with Plan of Care Patient        Problem List There are no active problems to display for this patient.   TAKACS,KELLY, PT 10/24/2015, 10:09 AM  [facility] [address] [place], [place], [address] Phone: [phone]   Fax:  [phone]  Name: Carmen Todd MRN: [id number] Date of Birth: 16-Apr-1941

## 2015-10-30 ENCOUNTER — Ambulatory Visit: Payer: Medicare Other | Admitting: Physical Therapy

## 2015-10-30 DIAGNOSIS — M25552 Pain in left hip: Secondary | ICD-10-CM

## 2015-10-30 DIAGNOSIS — R269 Unspecified abnormalities of gait and mobility: Secondary | ICD-10-CM | POA: Diagnosis not present

## 2015-10-30 DIAGNOSIS — R29898 Other symptoms and signs involving the musculoskeletal system: Secondary | ICD-10-CM | POA: Diagnosis not present

## 2015-10-30 DIAGNOSIS — M25561 Pain in right knee: Secondary | ICD-10-CM | POA: Diagnosis not present

## 2015-10-30 DIAGNOSIS — M25562 Pain in left knee: Secondary | ICD-10-CM | POA: Diagnosis not present

## 2015-10-30 DIAGNOSIS — M25652 Stiffness of left hip, not elsewhere classified: Secondary | ICD-10-CM

## 2015-10-30 NOTE — Therapy (Signed)
[facility] [address], [place], [place], [address] Phone: [phone]   Fax:  [phone]  Physical Therapy Treatment  Patient Details  Name: Carmen Todd MRN: [id number] Date of Birth: 1941-07-20 Referring Provider: London Pepper, MD  Encounter Date: 10/30/2015      PT End of Session - 10/30/15 1009    Visit Number 6   Number of Visits 10   Date for PT Re-Evaluation 12/06/15   PT Start Time 0923   PT Stop Time 1016   PT Time Calculation (min) 53 min   Activity Tolerance Patient tolerated treatment well      Past Medical History  Diagnosis Date  . Thyroid disease   . Hypertension   . Hyperlipidemia     Past Surgical History  Procedure Laterality Date  . Eye surgery    . Appendectomy      There were no vitals filed for this visit.  Visit Diagnosis:  Hip pain, acute, left  Abnormality of gait  Hip stiffness, left      Subjective Assessment - 10/30/15 1022    Subjective I'm getting better.  Still hurts to put on my socks.  Hurts some during the day getting up out of the chair.  I was able to lie on my left side for a few minutes.     Currently in Pain? Yes   Pain Location Hip   Pain Orientation Left;Lateral   Pain Type Acute pain                         [facility] Adult PT Treatment/Exercise - 10/30/15 0001    Modalities   Modalities Moist Heat   Moist Heat Therapy   Number Minutes Moist Heat 10 Minutes   Moist Heat Location Hip   Iontophoresis   Type of Iontophoresis Dexamethasone   Location Lt greater trochanter   Dose 1.0 cc  #4   Time 6 hour wear   Manual Therapy   Manual Therapy Muscle Energy Technique   Joint Mobilization left hip long axis distraction, inferior mob with belt. AP in IR grade 2 3x 20 sec   Soft tissue mobilization gluteals, piriformis instrument assisted and manual soft tissue (Graston technique G4)   Muscle Energy Technique piriformis  contract/relax 3x 5 sec          Trigger Point Dry Needling - 10/30/15 1015    Consent Given? Yes   Muscles Treated Lower Body Gluteus minimus;Gluteus maximus;Piriformis   Gluteus Maximus Response Palpable increased muscle length   Gluteus Minimus Response Palpable increased muscle length   Piriformis Response Palpable increased muscle length       Left only.       PT Education - 10/30/15 1010    Education provided Yes   Education Details dry needling after care   Person(s) Educated Patient   Methods Explanation;Handout   Comprehension Verbalized understanding          PT Short Term Goals - 10/30/15 1030    PT SHORT TERM GOAL #1   Title be independent in initial HEP   Status Achieved   PT SHORT TERM GOAL #2   Title report at 30% reduction in Lt hip pain with standing and walking   Time 4   Period Weeks   Status On-going   PT SHORT TERM GOAL #3   Title reduce hip and knee pain to walk for 20-25 minutes without need to rest to allow  for return to walking for exercise   Time 4   Period Weeks   Status On-going   PT SHORT TERM GOAL #4   Title report < or = to 4/10 Lt hip pain with negotiating steps   Time 4   Period Weeks   Status On-going           PT Long Term Goals - 10/30/15 1031    PT LONG TERM GOAL #1   Title be independent in advanced HEP   Time 8   Period Weeks   Status On-going   PT LONG TERM GOAL #2   Title reduce FOTO to < or = to 43% limitation   Time 8   Period Weeks   Status On-going   PT LONG TERM GOAL #3   Title improve Lt hip ER AROM to cross leg to put on shoes without limitation   Time 8   Period Weeks   Status On-going   PT LONG TERM GOAL #4   Title report a 60% reduction in Lt hip pain with standing and walking   Time 8   Period Weeks   Status On-going   PT LONG TERM GOAL #5   Title demonstrate symmetry and neutral trunk posture with ambulating on level surface   Time 8   Period Weeks   Status On-going                Plan - 10/30/15 1021    Clinical Impression Statement The patient reports she has made some improvement but still with left lateral hip pain with rising and left sidelying.  Very tender with palpation of left gluteals and piriformis muscles.  She is receptive to dry needling and manual therapy with improved muscle length noted post treatment although ER and IR limited.  Therapist closely monitoring response throughout treatment session.     PT Next Visit Plan assess response to 1st DN; 1 more ionto as needed; hip mobs and soft tissue mobilization;  moist heat;  add low level strengthening (clams) as pain decreases;  check progress toward goals        Problem List There are no active problems to display for this patient.   Alvera Singh 10/30/2015, 10:33 AM  [facility] [address], [place], [place], [address] Phone: [phone]   Fax:  [phone]  Name: Carmen Todd MRN: [id number] Date of Birth: [birth year]  Ruben Im, PT 10/30/2015 10:33 AM Phone: [phone] Fax: [phone]

## 2015-10-30 NOTE — Patient Instructions (Signed)

## 2015-11-01 ENCOUNTER — Ambulatory Visit: Payer: Medicare Other | Admitting: Physical Therapy

## 2015-11-01 DIAGNOSIS — M25652 Stiffness of left hip, not elsewhere classified: Secondary | ICD-10-CM | POA: Diagnosis not present

## 2015-11-01 DIAGNOSIS — M25552 Pain in left hip: Secondary | ICD-10-CM | POA: Diagnosis not present

## 2015-11-01 DIAGNOSIS — R29898 Other symptoms and signs involving the musculoskeletal system: Secondary | ICD-10-CM | POA: Diagnosis not present

## 2015-11-01 DIAGNOSIS — M25562 Pain in left knee: Secondary | ICD-10-CM | POA: Diagnosis not present

## 2015-11-01 DIAGNOSIS — M25561 Pain in right knee: Secondary | ICD-10-CM | POA: Diagnosis not present

## 2015-11-01 DIAGNOSIS — R269 Unspecified abnormalities of gait and mobility: Secondary | ICD-10-CM | POA: Diagnosis not present

## 2015-11-01 NOTE — Therapy (Signed)
[facility] [address], [place], [place], [address] Phone: [phone]   Fax:  [phone]  Physical Therapy Treatment  Patient Details  Name: Carmen Todd MRN: [id number] Date of Birth: [birth year] Referring Provider: London Pepper, MD  Encounter Date: 11/01/2015      PT End of Session - 11/01/15 0924    Visit Number 7   Number of Visits 10   Date for PT Re-Evaluation 12/06/15   Authorization Type Medicare   PT Start Time 0844   PT Stop Time 0932   PT Time Calculation (min) 48 min   Activity Tolerance Patient tolerated treatment well      Past Medical History  Diagnosis Date  . Thyroid disease   . Hypertension   . Hyperlipidemia     Past Surgical History  Procedure Laterality Date  . Eye surgery    . Appendectomy      There were no vitals filed for this visit.  Visit Diagnosis:  Hip pain, acute, left  Abnormality of gait  Hip stiffness, left      Subjective Assessment - 11/01/15 0843    Subjective I had the best day yesterday.  I was a little achy.  Did some quick shopping at [facility] and took it easy.  I woke up this morning and it is really bothering me.  I was disappointed that after a good day yesterday, I'm back to square one.   Mild limp.   Currently in Pain? Yes   Pain Score 3    Pain Location Hip   Pain Orientation Left                         [facility] Adult PT Treatment/Exercise - 11/01/15 0001    Knee/Hip Exercises: Stretches   Hip Flexor Stretch Left;5 reps;10 seconds   Other Knee/Hip Stretches hip/knee flexion supine with LEs on ball 7x   Other Knee/Hip Stretches gentle piriformis stretch with LEs on red ball 5x 3 sec hold   Knee/Hip Exercises: Sidelying   Other Sidelying Knee/Hip Exercises pre/post reassess of hip abd (less pain after treatment session)   Moist Heat Therapy   Number Minutes Moist Heat 10 Minutes   Moist Heat Location Hip   Iontophoresis   Type of Iontophoresis Dexamethasone   Location Lt greater trochanter   Dose 1cc #6   Time 6 hour wear   Manual Therapy   Joint Mobilization left hip long axis distraction, inferior mob with belt. AP in IR grade 2 3x 20 sec   Soft tissue mobilization gluteals, piriformis instrument assisted and manual soft tissue (Graston technique G4)   Muscle Energy Technique piriformis contract/relax 3x 5 sec          Trigger Point Dry Needling - 11/01/15 1424    Consent Given? Yes   Muscles Treated Lower Body Tensor fascia lata   Gluteus Maximus Response Twitch response elicited;Palpable increased muscle length   Gluteus Minimus Response Twitch response elicited;Palpable increased muscle length   Piriformis Response Palpable increased muscle length   Tensor Fascia Lata Response Palpable increased muscle length       Left only         PT Short Term Goals - 11/01/15 1426    PT SHORT TERM GOAL #1   Title be independent in initial HEP   Status Achieved   PT SHORT TERM GOAL #2   Title report at 30% reduction in Lt hip pain with standing  and walking   Time 4   Period Weeks   Status On-going   PT SHORT TERM GOAL #3   Title reduce hip and knee pain to walk for 20-25 minutes without need to rest to allow for return to walking for exercise   Time 4   Period Weeks   Status On-going   PT SHORT TERM GOAL #4   Title report < or = to 4/10 Lt hip pain with negotiating steps   Time 4   Period Weeks   Status On-going           PT Long Term Goals - 11/01/15 1427    PT LONG TERM GOAL #1   Title be independent in advanced HEP   Time 8   Period Weeks   Status On-going   PT LONG TERM GOAL #2   Title reduce FOTO to < or = to 43% limitation   Time 8   Period Weeks   Status On-going   PT LONG TERM GOAL #3   Title improve Lt hip ER AROM to cross leg to put on shoes without limitation   Time 8   Period Weeks   Status On-going   PT LONG TERM GOAL #4   Title report a 60% reduction in Lt  hip pain with standing and walking   Time 8   Period Weeks   Status On-going   PT LONG TERM GOAL #5   Title demonstrate symmetry and neutral trunk posture with ambulating on level surface   Time 8   Period Weeks   Status On-going               Plan - 11/01/15 0925    Clinical Impression Statement Patient reports yesterday was a really good day but this morning she is painful again.  Pre and post assessments of hip ER in sitting and sidelying hip abduction less painful  and with increased ROM.  Overall less tenderness and decreased trigger point size and number.  Therapist closely monitoring response throughout treatment session.  Discontinue ionto.     PT Next Visit Plan assess response to 2nd DN;  hip mobs and soft tissue mobilization;  moist heat;  add low level strengthening (clams) as pain decreases;  check progress toward goals        Problem List There are no active problems to display for this patient.   Alvera Singh 11/01/2015, 2:29 PM  [facility] [address] [place], [place], [address] Phone: [phone]   Fax:  [phone]  Name: Carmen Todd MRN: [id number] Date of Birth: Mar 17, 1941  Ruben Im, PT 11/01/2015 2:29 PM Phone: [phone] Fax: [phone]

## 2015-11-06 ENCOUNTER — Ambulatory Visit: Payer: Medicare Other | Admitting: Physical Therapy

## 2015-11-06 DIAGNOSIS — M25652 Stiffness of left hip, not elsewhere classified: Secondary | ICD-10-CM | POA: Diagnosis not present

## 2015-11-06 DIAGNOSIS — M25562 Pain in left knee: Secondary | ICD-10-CM | POA: Diagnosis not present

## 2015-11-06 DIAGNOSIS — R29898 Other symptoms and signs involving the musculoskeletal system: Secondary | ICD-10-CM | POA: Diagnosis not present

## 2015-11-06 DIAGNOSIS — M25552 Pain in left hip: Secondary | ICD-10-CM | POA: Diagnosis not present

## 2015-11-06 DIAGNOSIS — R269 Unspecified abnormalities of gait and mobility: Secondary | ICD-10-CM | POA: Diagnosis not present

## 2015-11-06 DIAGNOSIS — M25561 Pain in right knee: Secondary | ICD-10-CM | POA: Diagnosis not present

## 2015-11-06 NOTE — Therapy (Signed)
[facility] [address], [place], [place], [address] Phone: [phone]   Fax:  [phone]  Physical Therapy Treatment  Patient Details  Name: Carmen Todd MRN: [id number] Date of Birth: Jun 24, 1941 Referring Provider: London Pepper, MD  Encounter Date: 11/06/2015      PT End of Session - 11/06/15 1123    Visit Number 8   Number of Visits 10   Date for PT Re-Evaluation 12/06/15   Authorization Type Medicare      Past Medical History  Diagnosis Date  . Thyroid disease   . Hypertension   . Hyperlipidemia     Past Surgical History  Procedure Laterality Date  . Eye surgery    . Appendectomy      There were no vitals filed for this visit.  Visit Diagnosis:  Hip pain, acute, left  Abnormality of gait  Hip stiffness, left      Subjective Assessment - 11/06/15 1016    Subjective I was sore the 1st and 2nd day.  Now it is a dull ache rather than pain.  If I'm sitting or lying down no pain.  I still can't tie my shoes ( for the last 2 months.)  I think the dry needling is helpful  and the pain is not as intense and severe as it was.     Currently in Pain? Yes   Pain Score 3    Pain Orientation Left   Pain Descriptors / Indicators Aching   Pain Type Chronic pain   Pain Onset More than a month ago   Pain Frequency Intermittent                         [facility] Adult PT Treatment/Exercise - 11/06/15 0001    Knee/Hip Exercises: Aerobic   Nustep L 1 4 min   Knee/Hip Exercises: Standing   Other Standing Knee Exercises hip extension isometric 5x 5 sec holds R/L   Knee/Hip Exercises: Supine   Other Supine Knee/Hip Exercises red band B hip abd/ER and single leg 5x each   Knee/Hip Exercises: Sidelying   Clams discontinued   Moist Heat Therapy   Number Minutes Moist Heat 10 Minutes   Moist Heat Location Hip   Manual Therapy   Joint Mobilization left hip long axis distraction, inferior mob  with belt. AP in IR grade 2 3x 20 sec   Soft tissue mobilization gluteals, piriformis instrument assisted and manual soft tissue (Graston technique G4)   Muscle Energy Technique piriformis contract/relax 3x 5 sec          Trigger Point Dry Needling - 11/06/15 1836    Consent Given? Yes   Gluteus Maximus Response Twitch response elicited;Palpable increased muscle length   Gluteus Minimus Response Palpable increased muscle length   Piriformis Response Palpable increased muscle length   Tensor Fascia Lata Response Palpable increased muscle length       Left LE only          PT Short Term Goals - 11/06/15 1844    PT SHORT TERM GOAL #1   Title be independent in initial HEP   Status Achieved   PT SHORT TERM GOAL #2   Title report at 30% reduction in Lt hip pain with standing and walking   Time 4   Period Weeks   Status On-going   PT SHORT TERM GOAL #3   Title reduce hip and knee pain to walk for 20-25 minutes  without need to rest to allow for return to walking for exercise   Time 4   Period Weeks   Status On-going   PT SHORT TERM GOAL #4   Title report < or = to 4/10 Lt hip pain with negotiating steps   Time 4   Period Weeks   Status On-going           PT Long Term Goals - 11/06/15 1844    PT LONG TERM GOAL #1   Title be independent in advanced HEP   Time 8   Period Weeks   Status On-going   PT LONG TERM GOAL #2   Title reduce FOTO to < or = to 43% limitation   Time 8   Period Weeks   Status On-going   PT LONG TERM GOAL #3   Title improve Lt hip ER AROM to cross leg to put on shoes without limitation   Time 8   Period Weeks   Status On-going   PT LONG TERM GOAL #4   Title report a 60% reduction in Lt hip pain with standing and walking   Time 8   Period Weeks   Status On-going   PT LONG TERM GOAL #5   Title demonstrate symmetry and neutral trunk posture with ambulating on level surface   Time 8   Period Weeks   Status On-going                Plan - 11/06/15 1837    Clinical Impression Statement The patient states she feels she is getting better although still with difficulty putting on socks/shoes with hip in ER.  Mild limp.  Patient with lateral knee pain with both Nu-Step, Bike and with hip ER (clam ex).  Therapist closely monitoring response and modifying as needed.     PT Next Visit Plan assess response to 3rd DN;  hip mobs and soft tissue mobilization;  moist heat;  add hip abd isometric;  check progress toward goals and schedule additional appts as needed        Problem List There are no active problems to display for this patient.   Alvera Singh 11/06/2015, 6:45 PM  [facility] [address] [place], [place], [address] Phone: [phone]   Fax:  [phone]  Name: Carmen Todd MRN: [id number] Date of Birth: 21-Jul-1941   Ruben Im, PT 11/06/2015 6:46 PM Phone: [phone] Fax: [phone]

## 2015-11-08 DIAGNOSIS — M8589 Other specified disorders of bone density and structure, multiple sites: Secondary | ICD-10-CM | POA: Diagnosis not present

## 2015-11-09 ENCOUNTER — Ambulatory Visit: Payer: Medicare Other | Attending: Family Medicine | Admitting: Physical Therapy

## 2015-11-09 DIAGNOSIS — M25552 Pain in left hip: Secondary | ICD-10-CM | POA: Diagnosis not present

## 2015-11-09 DIAGNOSIS — R269 Unspecified abnormalities of gait and mobility: Secondary | ICD-10-CM | POA: Insufficient documentation

## 2015-11-09 DIAGNOSIS — M25561 Pain in right knee: Secondary | ICD-10-CM | POA: Insufficient documentation

## 2015-11-09 DIAGNOSIS — M25652 Stiffness of left hip, not elsewhere classified: Secondary | ICD-10-CM | POA: Diagnosis not present

## 2015-11-09 DIAGNOSIS — R29898 Other symptoms and signs involving the musculoskeletal system: Secondary | ICD-10-CM | POA: Diagnosis not present

## 2015-11-09 DIAGNOSIS — M25562 Pain in left knee: Secondary | ICD-10-CM | POA: Insufficient documentation

## 2015-11-09 NOTE — Patient Instructions (Signed)
   [facility] [address], [address] [place], [place] [address] Phone # [phone] Fax [phone]  

## 2015-11-09 NOTE — Therapy (Signed)
[facility] [address], [place], [place], [address] Phone: [phone]   Fax:  [phone]  Physical Therapy Treatment  Patient Details  Name: Carmen Todd MRN: [id number] Date of Birth: 1941-07-12 Referring Provider: London Pepper, MD  Encounter Date: 11/09/2015      PT End of Session - 11/09/15 1009    Visit Number 9   Number of Visits 10   Date for PT Re-Evaluation 12/06/15   Authorization Type Medicare   PT Start Time 0925   PT Stop Time 1017   PT Time Calculation (min) 52 min   Activity Tolerance Patient tolerated treatment well      Past Medical History  Diagnosis Date  . Thyroid disease   . Hypertension   . Hyperlipidemia     Past Surgical History  Procedure Laterality Date  . Eye surgery    . Appendectomy      There were no vitals filed for this visit.  Visit Diagnosis:  Hip pain, acute, left  Abnormality of gait  Hip stiffness, left  Weakness of both legs      Subjective Assessment - 11/09/15 0927    Subjective I had the best day yesterday!!!  I even had to lie on my left side for a bone density test yesterday and that didn't bother me.   A little stiff this morning.     Currently in Pain? Yes   Pain Score 2    Pain Location Hip   Pain Orientation Left   Pain Descriptors / Indicators Aching;Sore                         [facility] Adult PT Treatment/Exercise - 11/09/15 0001    Knee/Hip Exercises: Standing   Other Standing Knee Exercises hip extension isometric 5x 5 sec holds R/L   Other Standing Knee Exercises hip abduction isometrics 5x 5 sec hold R/L   Knee/Hip Exercises: Supine   Straight Leg Raises AROM;Right;Left;5 reps   Other Supine Knee/Hip Exercises red band B hip abd/ER and single leg 5x each   Moist Heat Therapy   Number Minutes Moist Heat 10 Minutes   Moist Heat Location Hip   Manual Therapy   Soft tissue mobilization gluteals, piriformis instrument  assisted and manual soft tissue (Graston technique G4)          Trigger Point Dry Needling - 11/09/15 1011    Consent Given? Yes   Gluteus Maximus Response Twitch response elicited;Palpable increased muscle length   Gluteus Minimus Response Palpable increased muscle length   Piriformis Response Palpable increased muscle length   Tensor Fascia Lata Response Palpable increased muscle length      Left only.        PT Education - 11/09/15 0945    Education provided Yes   Education Details supine clams;  wall hip isometrics   Person(s) Educated Patient   Methods Explanation;Demonstration;Handout   Comprehension Verbalized understanding;Returned demonstration          PT Short Term Goals - 11/09/15 1016    PT SHORT TERM GOAL #1   Title be independent in initial HEP   Status Achieved   PT SHORT TERM GOAL #2   Title report at 30% reduction in Lt hip pain with standing and walking   Status Achieved   PT SHORT TERM GOAL #3   Title reduce hip and knee pain to walk for 20-25 minutes without need to rest to allow for  return to walking for exercise   Status Achieved   PT SHORT TERM GOAL #4   Title report < or = to 4/10 Lt hip pain with negotiating steps   Status Achieved           PT Long Term Goals - 11/06/15 1844    PT LONG TERM GOAL #1   Title be independent in advanced HEP   Time 8   Period Weeks   Status On-going   PT LONG TERM GOAL #2   Title reduce FOTO to < or = to 43% limitation   Time 8   Period Weeks   Status On-going   PT LONG TERM GOAL #3   Title improve Lt hip ER AROM to cross leg to put on shoes without limitation   Time 8   Period Weeks   Status On-going   PT LONG TERM GOAL #4   Title report a 60% reduction in Lt hip pain with standing and walking   Time 8   Period Weeks   Status On-going   PT LONG TERM GOAL #5   Title demonstrate symmetry and neutral trunk posture with ambulating on level surface   Time 8   Period Weeks   Status On-going                Plan - 11/09/15 1011    Clinical Impression Statement The patient reports decreased pain although she is fearful that pain will flare up again.  Overall she is 80% better she states.  Has been able to walk short outings to [place] and [employer] without major exacerbations.  All STGs met.    Sidelying clams are painful so modified to supine with band without pain.  Able to do hip wall isometrics with min discomfort.  Therapist closely monitoring response with all interventions.  Recommend decrease treatment frequency to 1x/week with tapering of visits as HEP is established.     PT Next Visit Plan assess response to 4th DN;  hip mobs and soft tissue mobilization;  moist heat;  G code next visit        Problem List There are no active problems to display for this patient.   Alvera Singh 11/09/2015, 10:26 AM  [facility] [address], [place], [place], [address] Phone: [phone]   Fax:  [phone]  Name: Carmen Todd MRN: [id number] Date of Birth: 1941/05/21  Ruben Im, PT 11/09/2015 10:31 AM Phone: [phone] Fax: [phone]

## 2015-11-12 ENCOUNTER — Encounter: Payer: Self-pay | Admitting: Physical Therapy

## 2015-11-12 ENCOUNTER — Ambulatory Visit: Payer: Medicare Other | Admitting: Physical Therapy

## 2015-11-12 DIAGNOSIS — M25652 Stiffness of left hip, not elsewhere classified: Secondary | ICD-10-CM | POA: Diagnosis not present

## 2015-11-12 DIAGNOSIS — R269 Unspecified abnormalities of gait and mobility: Secondary | ICD-10-CM

## 2015-11-12 DIAGNOSIS — M25561 Pain in right knee: Secondary | ICD-10-CM | POA: Diagnosis not present

## 2015-11-12 DIAGNOSIS — R29898 Other symptoms and signs involving the musculoskeletal system: Secondary | ICD-10-CM | POA: Diagnosis not present

## 2015-11-12 DIAGNOSIS — M25552 Pain in left hip: Secondary | ICD-10-CM | POA: Diagnosis not present

## 2015-11-12 DIAGNOSIS — M25562 Pain in left knee: Secondary | ICD-10-CM

## 2015-11-12 NOTE — Therapy (Signed)
[facility] [address] [place], [place], [address] Phone: [phone]   Fax:  [phone]  Physical Therapy Treatment  Patient Details  Name: Carmen Todd MRN: [id number] Date of Birth: 11/01/1940 Referring Provider: London Pepper, MD  Encounter Date: 11/12/2015      PT End of Session - 11/12/15 1230    Visit Number 10   Number of Visits 10   Date for PT Re-Evaluation 12/06/15   Authorization Type Medicare   PT Start Time [id number]   PT Stop Time 1243   PT Time Calculation (min) 59 min   Activity Tolerance Patient tolerated treatment well   Behavior During Therapy [facility] for tasks assessed/performed      Past Medical History  Diagnosis Date  . Thyroid disease   . Hypertension   . Hyperlipidemia     Past Surgical History  Procedure Laterality Date  . Eye surgery    . Appendectomy      There were no vitals filed for this visit.  Visit Diagnosis:  Hip pain, acute, left  Abnormality of gait  Hip stiffness, left  Weakness of both legs  Arthralgia of both knees      Subjective Assessment - 11/12/15 1156    Subjective Pt reports since yesterday increase of sorness without injury or cause. Pt wishes to have more dry needeling sessions   Pertinent History scoliosis, chronic LBP, chronic bil. knee pain   Limitations Walking   How long can you walk comfortably? limited to 15 minutes   Diagnostic tests none   Patient Stated Goals reduce hip pain, walk longer, perform vacuum.  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 Yes   Pain Score 3    Pain Location Hip   Pain Orientation Left   Pain Descriptors / Indicators Aching;Sore   Pain Type Chronic pain   Pain Onset More than a month ago   Pain Frequency Intermittent   Aggravating Factors  walking, standing, laying on Lt side   Pain Relieving Factors stretchiing, laying down   Multiple Pain  Sites No                         [facility] Adult PT Treatment/Exercise - 11/12/15 0001    Lumbar Exercises: Stretches   Single Knee to Chest Stretch 3 reps;20 seconds  diagonal knee to chest 20sec x 3 each elg   Lower Trunk Rotation 3 reps;20 seconds  with bolster on Rt side   Knee/Hip Exercises: Aerobic   Nustep --  pt wishes not to perform    Moist Heat Therapy   Number Minutes Moist Heat 15 Minutes   Moist Heat Location Hip  in Rt sidelying   Ultrasound   Ultrasound Location Lt lateral   Ultrasound Parameters 50% pulsed, 1 MHz x 6 min   Ultrasound Goals Pain   Manual Therapy   Soft tissue mobilization gluteals, piriformis and manual soft tissue with PROM  for elongation                PT Education - 11/12/15 1248    Education provided No   Education Details --   Forensic psychologist) Educated --   Methods --   Comprehension --          PT Short Term Goals - 11/09/15 1016    PT SHORT TERM GOAL #1   Title be independent  in initial HEP   Status Achieved   PT SHORT TERM GOAL #2   Title report at 30% reduction in Lt hip pain with standing and walking   Status Achieved   PT SHORT TERM GOAL #3   Title reduce hip and knee pain to walk for 20-25 minutes without need to rest to allow for return to walking for exercise   Status Achieved   PT SHORT TERM GOAL #4   Title report < or = to 4/10 Lt hip pain with negotiating steps   Status Achieved           PT Long Term Goals - 2015/12/07 1235    PT LONG TERM GOAL #1   Title be independent in advanced HEP   Time 8   Period Weeks   Status On-going   PT LONG TERM GOAL #2   Title reduce FOTO to < or = to 43% limitation   Time 8   Period Weeks   Status On-going   PT LONG TERM GOAL #3   Title improve Lt hip ER AROM to cross leg to put on shoes without limitation   Time 8   Period Weeks   Status On-going   PT LONG TERM GOAL #4   Title report a 60% reduction in Lt hip pain with standing and walking   Time 8    Period Weeks   Status On-going   PT LONG TERM GOAL #5   Title demonstrate symmetry and neutral trunk posture with ambulating on level surface   Time 8   Period Weeks   Status On-going               Plan - 07-Dec-2015 1231    Clinical Impression Statement Pt reports Dry Needeling is helping, and performs her exercises at home. However, since yesterday increase of soreness in left hip. Unable to perfrom pirirformis stretch in sitting with left due to tightness and pain. Pt will continue to benefit from skilled PT.     Pt will benefit from skilled therapeutic intervention in order to improve on the following deficits Pain;Postural dysfunction;Decreased strength;Impaired flexibility;Improper body mechanics;Decreased activity tolerance;Abnormal gait;Decreased range of motion;Difficulty walking;Increased muscle spasms   Rehab Potential Good   PT Frequency 2x / week   PT Duration 8 weeks   PT Treatment/Interventions ADLs/Self Care Home Management;Cryotherapy;Electrical Stimulation;Iontophoresis 4mg /ml Dexamethasone;Moist Heat;Therapeutic exercise;Therapeutic activities;Functional mobility training;Stair training;Gait training;Ultrasound;Neuromuscular re-education;Manual techniques;Taping;Dry needling;Passive range of motion   PT Next Visit Plan Pt with good relieve from DN;  pt wishes to continue with [place],  hip mobs and soft tissue mobilization;  moist heat   Consulted and Agree with Plan of Care Patient          G-Codes - December 07, 2015 1237    Functional Assessment Tool Used FOTO: 43% limitation   Functional Limitation Mobility: Walking and moving around   Mobility: Walking and Moving Around Current Status [phone]) At least 40 percent but less than 60 percent impaired, limited or restricted   Mobility: Walking and Moving Around Goal Status [phone]) At least 40 percent but less than 60 percent impaired, limited or restricted      Problem List There are no active problems to display for this  patient.  Sigurd Sos, PT 2015-12-07 1:05 PM  [name], PTA 12-07-15 12:55 PM  [facility] [address] [place], [place], [address] Phone: [phone]   Fax:  [phone]  Name: Carmen Todd MRN: [id number] Date of Birth: 30-Jan-1941

## 2015-11-14 DIAGNOSIS — H353222 Exudative age-related macular degeneration, left eye, with inactive choroidal neovascularization: Secondary | ICD-10-CM | POA: Diagnosis not present

## 2015-11-14 DIAGNOSIS — H3581 Retinal edema: Secondary | ICD-10-CM | POA: Diagnosis not present

## 2015-11-14 DIAGNOSIS — H35371 Puckering of macula, right eye: Secondary | ICD-10-CM | POA: Diagnosis not present

## 2015-11-14 DIAGNOSIS — H353112 Nonexudative age-related macular degeneration, right eye, intermediate dry stage: Secondary | ICD-10-CM | POA: Diagnosis not present

## 2015-11-16 DIAGNOSIS — M8588 Other specified disorders of bone density and structure, other site: Secondary | ICD-10-CM | POA: Diagnosis not present

## 2015-11-19 ENCOUNTER — Ambulatory Visit: Payer: Medicare Other | Admitting: Physical Therapy

## 2015-11-19 DIAGNOSIS — M25561 Pain in right knee: Secondary | ICD-10-CM | POA: Diagnosis not present

## 2015-11-19 DIAGNOSIS — M25552 Pain in left hip: Secondary | ICD-10-CM

## 2015-11-19 DIAGNOSIS — M25562 Pain in left knee: Secondary | ICD-10-CM | POA: Diagnosis not present

## 2015-11-19 DIAGNOSIS — R269 Unspecified abnormalities of gait and mobility: Secondary | ICD-10-CM | POA: Diagnosis not present

## 2015-11-19 DIAGNOSIS — R29898 Other symptoms and signs involving the musculoskeletal system: Secondary | ICD-10-CM | POA: Diagnosis not present

## 2015-11-19 DIAGNOSIS — M25652 Stiffness of left hip, not elsewhere classified: Secondary | ICD-10-CM

## 2015-11-19 NOTE — Therapy (Signed)
[facility] [address] [place], [place], [address] Phone: [phone]   Fax:  [phone]  Physical Therapy Treatment  Patient Details  Name: Carmen Todd MRN: [id number] Date of Birth: [birth year] Referring Provider: London Pepper, MD  Encounter Date: 11/19/2015      PT End of Session - 11/19/15 1652    Visit Number 11   Number of Visits 20   Date for PT Re-Evaluation 12/06/15   Authorization Type Medicare   PT Start Time 0930   PT Stop Time 1020   PT Time Calculation (min) 50 min   Activity Tolerance Patient tolerated treatment well      Past Medical History  Diagnosis Date  . Thyroid disease   . Hypertension   . Hyperlipidemia     Past Surgical History  Procedure Laterality Date  . Eye surgery    . Appendectomy      There were no vitals filed for this visit.  Visit Diagnosis:  Hip pain, acute, left  Abnormality of gait  Hip stiffness, left  Weakness of both legs      Subjective Assessment - 11/19/15 0927    Subjective Last week when I was here, I felt like I had gone backwards.  I think the exercises cause agony sometimes.  Progress is up and down.  I have great times and then other times when it hurts really bad.  My bone density was low so I started on Fosamax on Saturday.    Tender in that one spot.     Currently in Pain? Yes   Pain Score 1    Pain Location Hip   Pain Orientation Left   Pain Onset More than a month ago   Pain Frequency Intermittent                         [facility] Adult PT Treatment/Exercise - 11/19/15 0001    Knee/Hip Exercises: Standing   Other Standing Knee Exercises review of wall isometrics and modifcations made   Other Standing Knee Exercises review of home stretches   Moist Heat Therapy   Number Minutes Moist Heat 10 Minutes   Moist Heat Location Hip   Manual Therapy   Joint Mobilization left hip long axis distraction, inferior mob with  belt. AP in IR grade 2 3x 20 sec   Soft tissue mobilization gluteals, piriformis and manual soft tissue with PROM  for elongation   Muscle Energy Technique piriformis contract/relax 3x 5 sec          Trigger Point Dry Needling - 11/19/15 1651    Consent Given? Yes   Gluteus Maximus Response Twitch response elicited;Palpable increased muscle length   Gluteus Minimus Response Palpable increased muscle length   Piriformis Response Palpable increased muscle length   Tensor Fascia Lata Response Palpable increased muscle length     Left only         PT Education - 11/19/15 1010    Education provided Yes   Education Details doorway psoas stretch   Person(s) Educated Patient   Methods Explanation;Demonstration;Handout   Comprehension Verbalized understanding;Returned demonstration          PT Short Term Goals - 11/19/15 1656    PT SHORT TERM GOAL #1   Title be independent in initial HEP   Status Achieved   PT SHORT TERM GOAL #2   Title report at 30% reduction in Lt hip pain with standing and walking  Status Achieved   PT SHORT TERM GOAL #3   Title reduce hip and knee pain to walk for 20-25 minutes without need to rest to allow for return to walking for exercise   Status Achieved   PT SHORT TERM GOAL #4   Title report < or = to 4/10 Lt hip pain with negotiating steps   Status Achieved           PT Long Term Goals - 11/19/15 1656    PT LONG TERM GOAL #1   Title be independent in advanced HEP   Time 8   Period Weeks   Status On-going   PT LONG TERM GOAL #2   Title reduce FOTO to < or = to 43% limitation   Time 8   Period Weeks   Status On-going   PT LONG TERM GOAL #3   Title improve Lt hip ER AROM to cross leg to put on shoes without limitation   Time 8   Period Weeks   Status On-going   PT LONG TERM GOAL #4   Title report a 60% reduction in Lt hip pain with standing and walking   Time 8   Period Weeks   Status On-going   PT LONG TERM GOAL #5   Title  demonstrate symmetry and neutral trunk posture with ambulating on level surface   Time 8   Period Weeks   Status On-going               Plan - 11/19/15 1652    Clinical Impression Statement The patient reports up/down days but overall good improvement since starting PT.  She demonstrates compliance with HEP but has needed many modifications to find ex's which do not exacerbate her hip, knee or back.  She should meet remaining goals in 1-2 visits.  Therapist closley monitoring response with all interventions.     PT Next Visit Plan Continue 1-2 visits to include dry needling,  soft tissue, joint mobs, instrument assisted soft tissue;  finalize/modify HEP as needed        Problem List There are no active problems to display for this patient.   Alvera Singh 11/19/2015, 4:57 PM  [facility] [address], [place], [place], [address] Phone: [phone]   Fax:  [phone]  Name: Carmen Todd MRN: [id number] Date of Birth: 03-02-1941  Ruben Im, PT 11/19/2015 4:58 PM Phone: [phone] Fax: [phone]

## 2015-11-26 ENCOUNTER — Encounter: Payer: Medicare Other | Admitting: Physical Therapy

## 2015-11-27 ENCOUNTER — Ambulatory Visit: Payer: Medicare Other | Admitting: Physical Therapy

## 2015-11-27 DIAGNOSIS — R29898 Other symptoms and signs involving the musculoskeletal system: Secondary | ICD-10-CM

## 2015-11-27 DIAGNOSIS — M25552 Pain in left hip: Secondary | ICD-10-CM

## 2015-11-27 DIAGNOSIS — M25561 Pain in right knee: Secondary | ICD-10-CM | POA: Diagnosis not present

## 2015-11-27 DIAGNOSIS — R269 Unspecified abnormalities of gait and mobility: Secondary | ICD-10-CM | POA: Diagnosis not present

## 2015-11-27 DIAGNOSIS — M25652 Stiffness of left hip, not elsewhere classified: Secondary | ICD-10-CM

## 2015-11-27 DIAGNOSIS — M25562 Pain in left knee: Secondary | ICD-10-CM | POA: Diagnosis not present

## 2015-11-27 NOTE — Therapy (Signed)
[facility] [address], [address] [place], [place], [address] Phone: [phone]   Fax:  [phone]  Physical Therapy Treatment/Discharge Summary  Patient Details  Name: Carmen Todd MRN: [id number] Date of Birth: Feb 05, 1941 Referring Provider: Farris Has, MD  Encounter Date: 11/27/2015      PT End of Session - 11/27/15 1238    Visit Number 12   Number of Visits 20   Date for PT Re-Evaluation 12/06/15   Authorization Type Medicare   PT Start Time 1145   PT Stop Time 1243   PT Time Calculation (min) 58 min   Activity Tolerance Patient tolerated treatment well      Past Medical History  Diagnosis Date  . Thyroid disease   . Hypertension   . Hyperlipidemia     Past Surgical History  Procedure Laterality Date  . Eye surgery    . Appendectomy      There were no vitals filed for this visit.  Visit Diagnosis:  Hip pain, acute, left  Abnormality of gait  Hip stiffness, left  Weakness of both legs      Subjective Assessment - 11/27/15 1151    Subjective Doing better although still with buring sensation about once a day.   I am prop on my left side a little.   I don't do the exercises that hurt. Full weight bearing on left "feels insecure and a little painful."    No consistent pattern on aggravating factors.  I can tell I can stand and walk longer.     Pain Score 1    Pain Location Hip   Pain Orientation Left            [facility] PT Assessment - 11/27/15 0001    Observation/Other Assessments   Focus on Therapeutic Outcomes (FOTO)  50% limitation  43% on 11/12/15   AROM   Overall AROM Comments left hip flexion 95 degrees, ER 30 , IR 30   Strength   Overall Strength Comments left hip flex, abd, knee extension 4+/5                     [facility] Adult PT Treatment/Exercise - 11/27/15 0001    Moist Heat Therapy   Number Minutes Moist Heat 10 Minutes   Moist Heat Location Hip   Manual Therapy    Joint Mobilization left hip long axis distraction, inferior mob with belt. AP in IR grade 2 3x 20 sec   Soft tissue mobilization gluteals, piriformis and manual soft tissue with PROM  for elongation   Muscle Energy Technique piriformis contract/relax 3x 5 sec          Trigger Point Dry Needling - 11/27/15 1231    Consent Given? Yes   Gluteus Maximus Response Twitch response elicited;Palpable increased muscle length   Gluteus Minimus Response Twitch response elicited;Palpable increased muscle length   Piriformis Response Twitch response elicited;Palpable increased muscle length   Tensor Fascia Lata Response Palpable increased muscle length                PT Short Term Goals - 11/27/15 1544    PT SHORT TERM GOAL #1   Title be independent in initial HEP   Status Achieved   PT SHORT TERM GOAL #2   Title report at 30% reduction in Lt hip pain with standing and walking   Status Achieved   PT SHORT TERM GOAL #3   Title reduce hip and knee pain to walk  for 20-25 minutes without need to rest to allow for return to walking for exercise   Status Achieved   PT SHORT TERM GOAL #4   Title report < or = to 4/10 Lt hip pain with negotiating steps   Status Achieved           PT Long Term Goals - 12/19/15 1156    PT LONG TERM GOAL #1   Title be independent in advanced HEP   Time 8   Period Weeks   Status On-going   PT LONG TERM GOAL #2   Title reduce FOTO to < or = to 43% limitation   Time 8   Period Weeks   Status Partially Met   PT LONG TERM GOAL #3   Title improve Lt hip ER AROM to cross leg to put on shoes without limitation   Time 8   Period Weeks   Status Partially Met   PT LONG TERM GOAL #4   Title report a 60% reduction in Lt hip pain with standing and walking   Baseline 80-85% better   Time 8   Period Weeks   Status Achieved   PT LONG TERM GOAL #5   Title demonstrate symmetry and neutral trunk posture with ambulating on level surface   Time 8   Period  Weeks   Status Achieved               Plan - 19-Dec-2015 1239    Clinical Impression Statement The patient reports good compliance with HEP.  Significant reduction in trigger point size and number.  Overall improvement at 80%.  Significant improvement in hip ROM and strength.   Functional outcome score improved as well.  She has met the majority of goals and should make further improvements in pain and  strength as she continues with her HEP.            G-Codes - December 19, 2015 1544    Functional Assessment Tool Used FOTO   Functional Limitation Mobility: Walking and moving around   Mobility: Walking and Moving Around Current Status [phone]) At least 40 percent but less than 60 percent impaired, limited or restricted   Mobility: Walking and Moving Around Goal Status [phone]) At least 40 percent but less than 60 percent impaired, limited or restricted   Mobility: Walking and Moving Around Discharge Status [phone]) At least 40 percent but less than 60 percent impaired, limited or restricted     PHYSICAL THERAPY DISCHARGE SUMMARY  Visits from Start of Care: 12  Current functional level related to goals / functional outcomes: See clinical impressions above.  Patient received multiple treatment interventions including manual therapy, exercise and neuromuscular education, U/S, dry needling   Remaining deficits: As above   Education / Equipment: Comprehensive HEP Plan: Patient agrees to discharge.  Patient goals were partially met. Patient is being discharged due to meeting the stated rehab goals.  ?????  Ruben Im, PT 12-19-2015 3:53 PM Phone: [phone] Fax: [phone]      Problem List There are no active problems to display for this patient.   Alvera Singh 12/19/2015, 3:51 PM  [facility] [address] [place], [place], [address] Phone: [phone]   Fax:  [phone]  Name: Carmen Todd MRN:  [id number] Date of Birth: April 08, 1941

## 2015-12-03 ENCOUNTER — Encounter: Payer: Medicare Other | Admitting: Physical Therapy

## 2015-12-04 ENCOUNTER — Encounter: Payer: Medicare Other | Admitting: Physical Therapy

## 2016-01-15 ENCOUNTER — Other Ambulatory Visit: Payer: Self-pay

## 2016-01-15 DIAGNOSIS — Z1231 Encounter for screening mammogram for malignant neoplasm of breast: Secondary | ICD-10-CM

## 2016-02-06 DIAGNOSIS — R6884 Jaw pain: Secondary | ICD-10-CM | POA: Diagnosis not present

## 2016-02-21 DIAGNOSIS — H401132 Primary open-angle glaucoma, bilateral, moderate stage: Secondary | ICD-10-CM | POA: Diagnosis not present

## 2016-03-12 ENCOUNTER — Ambulatory Visit
Admission: RE | Admit: 2016-03-12 | Discharge: 2016-03-12 | Disposition: A | Discharge status: Home / Self Care | Payer: Medicare Other | Source: Ambulatory Visit

## 2016-03-12 DIAGNOSIS — Z1231 Encounter for screening mammogram for malignant neoplasm of breast: Secondary | ICD-10-CM

## 2016-04-08 DIAGNOSIS — H3530 Unspecified macular degeneration: Secondary | ICD-10-CM | POA: Diagnosis not present

## 2016-04-08 DIAGNOSIS — E785 Hyperlipidemia, unspecified: Secondary | ICD-10-CM | POA: Diagnosis not present

## 2016-04-08 DIAGNOSIS — M8588 Other specified disorders of bone density and structure, other site: Secondary | ICD-10-CM | POA: Diagnosis not present

## 2016-04-08 DIAGNOSIS — H409 Unspecified glaucoma: Secondary | ICD-10-CM | POA: Diagnosis not present

## 2016-04-08 DIAGNOSIS — E039 Hypothyroidism, unspecified: Secondary | ICD-10-CM | POA: Diagnosis not present

## 2016-04-08 DIAGNOSIS — I10 Essential (primary) hypertension: Secondary | ICD-10-CM | POA: Diagnosis not present

## 2016-04-15 DIAGNOSIS — H353111 Nonexudative age-related macular degeneration, right eye, early dry stage: Secondary | ICD-10-CM | POA: Diagnosis not present

## 2016-04-15 DIAGNOSIS — H353122 Nonexudative age-related macular degeneration, left eye, intermediate dry stage: Secondary | ICD-10-CM | POA: Diagnosis not present

## 2016-04-15 DIAGNOSIS — H35371 Puckering of macula, right eye: Secondary | ICD-10-CM | POA: Diagnosis not present

## 2016-04-15 DIAGNOSIS — H3581 Retinal edema: Secondary | ICD-10-CM | POA: Diagnosis not present

## 2016-06-19 DIAGNOSIS — H401132 Primary open-angle glaucoma, bilateral, moderate stage: Secondary | ICD-10-CM | POA: Diagnosis not present

## 2016-08-04 ENCOUNTER — Ambulatory Visit
Admission: RE | Admit: 2016-08-04 | Discharge: 2016-08-04 | Disposition: A | Discharge status: Home / Self Care | Payer: Medicare Other | Source: Ambulatory Visit | Attending: Family Medicine | Admitting: Family Medicine

## 2016-08-04 ENCOUNTER — Other Ambulatory Visit: Payer: Self-pay | Admitting: Family Medicine

## 2016-08-04 DIAGNOSIS — R05 Cough: Secondary | ICD-10-CM

## 2016-08-04 DIAGNOSIS — R059 Cough, unspecified: Secondary | ICD-10-CM

## 2016-08-20 DIAGNOSIS — R05 Cough: Secondary | ICD-10-CM | POA: Diagnosis not present

## 2016-09-23 DIAGNOSIS — M1711 Unilateral primary osteoarthritis, right knee: Secondary | ICD-10-CM | POA: Diagnosis not present

## 2016-09-23 DIAGNOSIS — M1712 Unilateral primary osteoarthritis, left knee: Secondary | ICD-10-CM | POA: Diagnosis not present

## 2016-10-15 DIAGNOSIS — M1711 Unilateral primary osteoarthritis, right knee: Secondary | ICD-10-CM | POA: Diagnosis not present

## 2016-10-15 DIAGNOSIS — M170 Bilateral primary osteoarthritis of knee: Secondary | ICD-10-CM | POA: Diagnosis not present

## 2016-10-15 DIAGNOSIS — M1712 Unilateral primary osteoarthritis, left knee: Secondary | ICD-10-CM | POA: Diagnosis not present

## 2016-10-16 DIAGNOSIS — H35033 Hypertensive retinopathy, bilateral: Secondary | ICD-10-CM | POA: Diagnosis not present

## 2016-10-21 DIAGNOSIS — H35423 Microcystoid degeneration of retina, bilateral: Secondary | ICD-10-CM | POA: Diagnosis not present

## 2016-10-21 DIAGNOSIS — H353111 Nonexudative age-related macular degeneration, right eye, early dry stage: Secondary | ICD-10-CM | POA: Diagnosis not present

## 2016-10-21 DIAGNOSIS — H353222 Exudative age-related macular degeneration, left eye, with inactive choroidal neovascularization: Secondary | ICD-10-CM | POA: Diagnosis not present

## 2016-10-21 DIAGNOSIS — H43813 Vitreous degeneration, bilateral: Secondary | ICD-10-CM | POA: Diagnosis not present

## 2016-10-22 DIAGNOSIS — M170 Bilateral primary osteoarthritis of knee: Secondary | ICD-10-CM | POA: Diagnosis not present

## 2016-10-24 DIAGNOSIS — M25569 Pain in unspecified knee: Secondary | ICD-10-CM | POA: Diagnosis not present

## 2016-10-24 DIAGNOSIS — E039 Hypothyroidism, unspecified: Secondary | ICD-10-CM | POA: Diagnosis not present

## 2016-10-24 DIAGNOSIS — Z0000 Encounter for general adult medical examination without abnormal findings: Secondary | ICD-10-CM | POA: Diagnosis not present

## 2016-10-24 DIAGNOSIS — I10 Essential (primary) hypertension: Secondary | ICD-10-CM | POA: Diagnosis not present

## 2016-10-24 DIAGNOSIS — M8588 Other specified disorders of bone density and structure, other site: Secondary | ICD-10-CM | POA: Diagnosis not present

## 2016-10-24 DIAGNOSIS — E559 Vitamin D deficiency, unspecified: Secondary | ICD-10-CM | POA: Diagnosis not present

## 2016-10-24 DIAGNOSIS — E785 Hyperlipidemia, unspecified: Secondary | ICD-10-CM | POA: Diagnosis not present

## 2016-10-30 DIAGNOSIS — M170 Bilateral primary osteoarthritis of knee: Secondary | ICD-10-CM | POA: Diagnosis not present

## 2016-12-10 DIAGNOSIS — H01111 Allergic dermatitis of right upper eyelid: Secondary | ICD-10-CM | POA: Diagnosis not present

## 2017-01-20 DIAGNOSIS — H353111 Nonexudative age-related macular degeneration, right eye, early dry stage: Secondary | ICD-10-CM | POA: Diagnosis not present

## 2017-01-20 DIAGNOSIS — H353222 Exudative age-related macular degeneration, left eye, with inactive choroidal neovascularization: Secondary | ICD-10-CM | POA: Diagnosis not present

## 2017-01-30 ENCOUNTER — Other Ambulatory Visit: Payer: Self-pay | Admitting: Family Medicine

## 2017-01-30 DIAGNOSIS — Z1231 Encounter for screening mammogram for malignant neoplasm of breast: Secondary | ICD-10-CM

## 2017-02-19 DIAGNOSIS — H401132 Primary open-angle glaucoma, bilateral, moderate stage: Secondary | ICD-10-CM | POA: Diagnosis not present

## 2017-03-17 ENCOUNTER — Ambulatory Visit
Admission: RE | Admit: 2017-03-17 | Discharge: 2017-03-17 | Disposition: A | Discharge status: Home / Self Care | Payer: Medicare Other | Source: Ambulatory Visit | Attending: Family Medicine | Admitting: Family Medicine

## 2017-03-17 DIAGNOSIS — Z1231 Encounter for screening mammogram for malignant neoplasm of breast: Secondary | ICD-10-CM | POA: Diagnosis not present

## 2017-04-24 ENCOUNTER — Ambulatory Visit
Admission: RE | Admit: 2017-04-24 | Discharge: 2017-04-24 | Disposition: A | Discharge status: Home / Self Care | Payer: Medicare Other | Source: Ambulatory Visit | Attending: Family Medicine | Admitting: Family Medicine

## 2017-04-24 ENCOUNTER — Other Ambulatory Visit: Payer: Self-pay | Admitting: Family Medicine

## 2017-04-24 DIAGNOSIS — R05 Cough: Secondary | ICD-10-CM

## 2017-04-24 DIAGNOSIS — R059 Cough, unspecified: Secondary | ICD-10-CM

## 2017-04-24 DIAGNOSIS — E785 Hyperlipidemia, unspecified: Secondary | ICD-10-CM | POA: Diagnosis not present

## 2017-04-24 DIAGNOSIS — I10 Essential (primary) hypertension: Secondary | ICD-10-CM | POA: Diagnosis not present

## 2017-05-13 ENCOUNTER — Encounter: Payer: Self-pay | Admitting: Pulmonary Disease

## 2017-05-13 ENCOUNTER — Ambulatory Visit (INDEPENDENT_AMBULATORY_CARE_PROVIDER_SITE_OTHER): Payer: Medicare Other | Admitting: Pulmonary Disease

## 2017-05-13 DIAGNOSIS — R05 Cough: Secondary | ICD-10-CM

## 2017-05-13 DIAGNOSIS — R053 Chronic cough: Secondary | ICD-10-CM | POA: Insufficient documentation

## 2017-05-13 DIAGNOSIS — I10 Essential (primary) hypertension: Secondary | ICD-10-CM | POA: Diagnosis not present

## 2017-05-13 NOTE — Progress Notes (Signed)
Subjective:    Patient ID: Carmen Todd, female    DOB: Aug 01, 1941, 76 y.o.   MRN: [id number]  HPI  Chief Complaint  Patient presents with  . Advice Only    Referred by Dr. Orland Mustard due to a cough. Pt became sick November 2017 and states that she has not been able to cough since then and is here now for the referral. Pt used to be on lisinopril, been off for 3 weeks, and is wondering if it is from that. Denies any CP or SOB. Pt stated she had a cxr done x2 weeks ago.     76 year old remote smoker presents for evaluation of chronic cough. She has just moved down from [place] to [place] about 2 years ago with her husband Carmen Todd to his Parkinson's to be closer to her son. She reports a chest cold in 07/2016 which required 2 rounds of antibiotics and a chronic cough has persisted since then. She reports a dry cough that would come to her upper airway and occasionally from deep within her chest, no clear triggers, no seasonal variation and no relation to outdoors. Cough would improve with Robitussin Cough syrup and with a cough drop but was not affected by Tessalon. She reports associated mild shortness of breath during the summer while [place]  She saw her PCP about 2 weeks ago and the Cipro was stopped. She reports occasional postnasal drip but denies seasonal allergies. She reports GERD symptoms are controlled with Prevacid and denies any breakthrough heartburn. She smoked less than 10 pack years before she quit in 1972. She drank alcohol socially and quit 10 years ago. She is retired and has 2 adopted children She denies any change in her environment   I reviewed her chest x-ray from 07/2016 with suggest bibasal infiltrates and repeat from 04/2017 shows no infiltrates or effusions  Past Medical History:  Diagnosis Date  . Hyperlipidemia   . Hypertension   . Thyroid disease      Past Surgical History:  Procedure Laterality  Date  . APPENDECTOMY    . EYE SURGERY      Allergies  Allergen Reactions  . Alendronate Other (See Comments)    Jaw pain  . Hctz [Hydrochlorothiazide] Rash  . Inderal [Propranolol] Rash  . Morphine And Related Rash  . Penicillins Rash      Social History   Social History  . Marital status: Married    Spouse name: N/A  . Number of children: N/A  . Years of education: N/A   Occupational History  . Not on file.   Social History Main Topics  . Smoking status: Former Smoker    Packs/day: 0.50    Years: 16.00    Types: Cigarettes    Quit date: 42  . Smokeless tobacco: Never Used  . Alcohol use Not on file  . Drug use: Unknown  . Sexual activity: Not on file   Other Topics Concern  . Not on file   Social History Narrative  . No narrative on file       No family history on file.   Review of Systems   Positive for dry cough, indigestion, joint stiffness in her knees  Constitutional: negative for anorexia, fevers and sweats  Eyes: negative for irritation, redness and visual disturbance  Ears, nose, mouth, throat, and face: negative for earaches, epistaxis, nasal congestion and sore throat  Respiratory: negative for cough, dyspnea on exertion, sputum and  wheezing  Cardiovascular: negative for chest pain,  lower extremity edema, orthopnea, palpitations and syncope  Gastrointestinal: negative for abdominal pain, constipation, diarrhea, melena, nausea and vomiting  Genitourinary:negative for dysuria, frequency and hematuria  Hematologic/lymphatic: negative for bleeding, easy bruising and lymphadenopathy  Musculoskeletal:negative for arthralgias, muscle weakness and stiff joints  Neurological: negative for coordination problems, gait problems, headaches and weakness  Endocrine: negative for diabetic symptoms including polydipsia, polyuria and weight loss     Objective:   Physical Exam  Gen. Pleasant, well-nourished, in no distress, normal affect ENT - no  lesions, no post nasal drip Neck: No JVD, no thyromegaly, no carotid bruits Lungs: no use of accessory muscles, no dullness to percussion, clear without rales or rhonchi  Cardiovascular: Rhythm regular, heart sounds  normal, no murmurs or gallops, no peripheral edema Abdomen: soft and non-tender, no hepatosplenomegaly, BS normal. Musculoskeletal: No deformities, no cyanosis or clubbing Neuro:  alert, non focal        Assessment & Plan:

## 2017-05-13 NOTE — Patient Instructions (Signed)
Your cough may be related to lisinopril , in which case it should go away in another 3-4 weeks  Call us to report in 4 weeks  If cough persists beyond that, then we will undertake a trial of sinuses and reflux

## 2017-05-13 NOTE — Progress Notes (Signed)
More...

## 2017-05-13 NOTE — Assessment & Plan Note (Signed)
cough may be related to lisinopril , in which case it should go away in another 3-4 weeks  Call us to report in 4 weeks  If cough persists beyond that, then we will undertake a sequential trial of sinuses and reflux For postnasal drip, would recommend first-generation antihistaminic and decongestant for 4 weeks

## 2017-05-15 DIAGNOSIS — H35371 Puckering of macula, right eye: Secondary | ICD-10-CM | POA: Diagnosis not present

## 2017-05-15 DIAGNOSIS — H353222 Exudative age-related macular degeneration, left eye, with inactive choroidal neovascularization: Secondary | ICD-10-CM | POA: Diagnosis not present

## 2017-05-15 DIAGNOSIS — H43813 Vitreous degeneration, bilateral: Secondary | ICD-10-CM | POA: Diagnosis not present

## 2017-05-15 DIAGNOSIS — H353111 Nonexudative age-related macular degeneration, right eye, early dry stage: Secondary | ICD-10-CM | POA: Diagnosis not present

## 2017-05-29 DIAGNOSIS — Z23 Encounter for immunization: Secondary | ICD-10-CM | POA: Diagnosis not present

## 2017-06-10 ENCOUNTER — Telehealth: Payer: Self-pay | Admitting: Pulmonary Disease

## 2017-06-10 NOTE — Telephone Encounter (Signed)
Left detailed message for pt that we received her message and to contact us back if her cough returns. Nothing further needed at this time. Will sign off.

## 2017-06-25 DIAGNOSIS — H401132 Primary open-angle glaucoma, bilateral, moderate stage: Secondary | ICD-10-CM | POA: Diagnosis not present

## 2017-07-16 DIAGNOSIS — H00014 Hordeolum externum left upper eyelid: Secondary | ICD-10-CM | POA: Diagnosis not present

## 2017-08-06 DIAGNOSIS — H00014 Hordeolum externum left upper eyelid: Secondary | ICD-10-CM | POA: Diagnosis not present

## 2017-09-30 DIAGNOSIS — I10 Essential (primary) hypertension: Secondary | ICD-10-CM | POA: Diagnosis not present

## 2017-10-15 DIAGNOSIS — H401132 Primary open-angle glaucoma, bilateral, moderate stage: Secondary | ICD-10-CM | POA: Diagnosis not present

## 2017-10-28 DIAGNOSIS — M25569 Pain in unspecified knee: Secondary | ICD-10-CM | POA: Diagnosis not present

## 2017-10-28 DIAGNOSIS — Z0000 Encounter for general adult medical examination without abnormal findings: Secondary | ICD-10-CM | POA: Diagnosis not present

## 2017-10-28 DIAGNOSIS — E785 Hyperlipidemia, unspecified: Secondary | ICD-10-CM | POA: Diagnosis not present

## 2017-10-28 DIAGNOSIS — E039 Hypothyroidism, unspecified: Secondary | ICD-10-CM | POA: Diagnosis not present

## 2017-10-28 DIAGNOSIS — R0602 Shortness of breath: Secondary | ICD-10-CM | POA: Diagnosis not present

## 2017-10-28 DIAGNOSIS — I10 Essential (primary) hypertension: Secondary | ICD-10-CM | POA: Diagnosis not present

## 2017-10-28 DIAGNOSIS — M8588 Other specified disorders of bone density and structure, other site: Secondary | ICD-10-CM | POA: Diagnosis not present

## 2017-10-28 DIAGNOSIS — Z23 Encounter for immunization: Secondary | ICD-10-CM | POA: Diagnosis not present

## 2017-11-02 DIAGNOSIS — Z0000 Encounter for general adult medical examination without abnormal findings: Secondary | ICD-10-CM | POA: Diagnosis not present

## 2017-11-02 DIAGNOSIS — E785 Hyperlipidemia, unspecified: Secondary | ICD-10-CM | POA: Diagnosis not present

## 2017-11-02 DIAGNOSIS — R7301 Impaired fasting glucose: Secondary | ICD-10-CM | POA: Diagnosis not present

## 2017-11-02 DIAGNOSIS — R0602 Shortness of breath: Secondary | ICD-10-CM | POA: Diagnosis not present

## 2017-11-02 DIAGNOSIS — E039 Hypothyroidism, unspecified: Secondary | ICD-10-CM | POA: Diagnosis not present

## 2017-12-28 DIAGNOSIS — M8588 Other specified disorders of bone density and structure, other site: Secondary | ICD-10-CM | POA: Diagnosis not present

## 2018-01-08 IMAGING — US US CAROTID DUPLEX BILAT
1 series · 13 of 24 positions shown · non-contrast
Comparison: None.

CLINICAL DATA: Dizziness, syncope, hypertension and hyperlipidemia.

EXAM:
BILATERAL CAROTID DUPLEX ULTRASOUND
TECHNIQUE: Gray scale imaging, color Doppler and duplex ultrasound were
performed of bilateral carotid and vertebral arteries in the neck.

[Series 1: us carotid duplex bilat · 0.07mm/px · 13 of 37 slices shown]
[im 1/37]
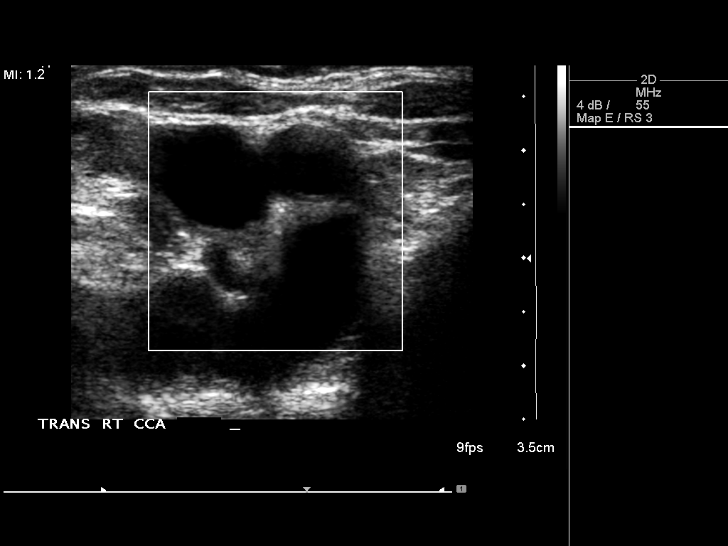
[im 4/37]
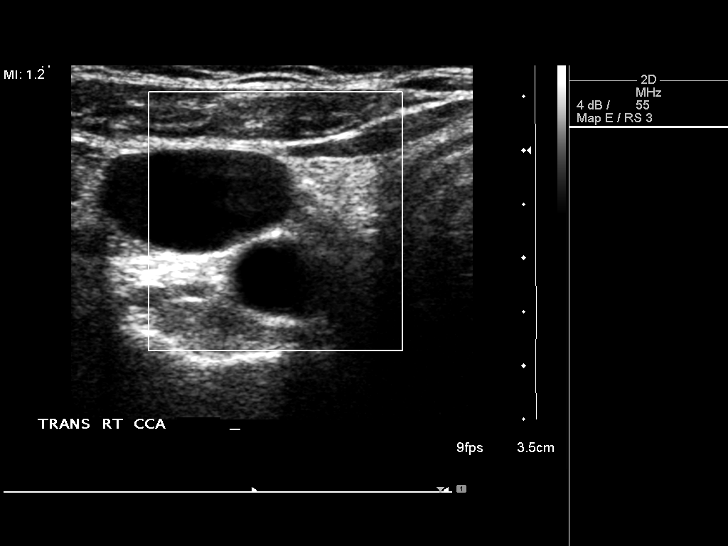
[im 7/37]
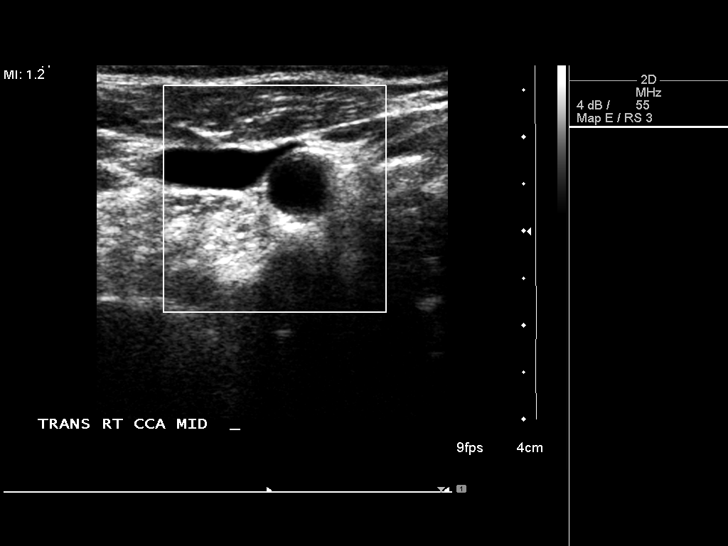
[im 10/37]
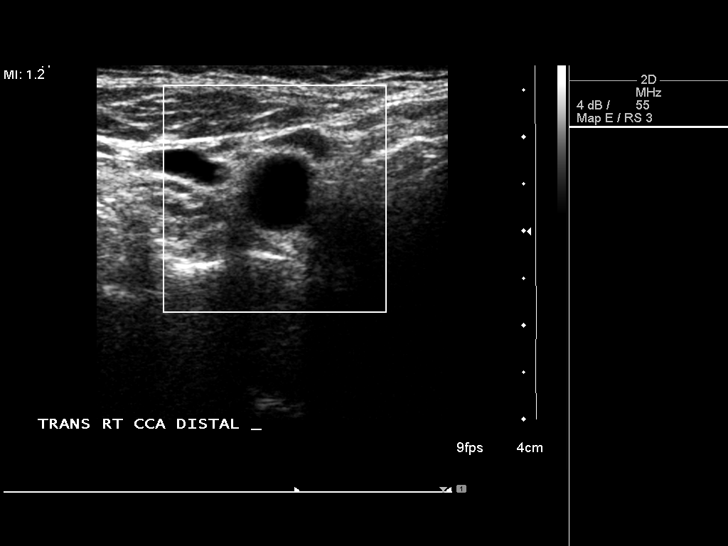
[im 13/37]
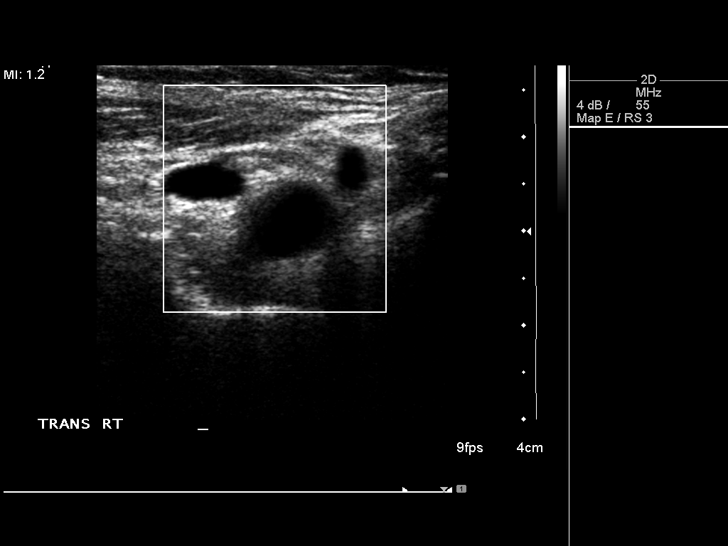
[im 16/37]
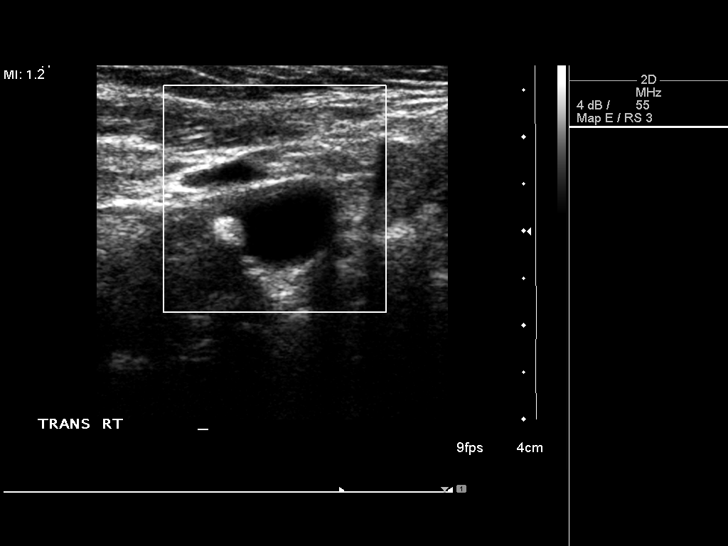
[im 19/37]
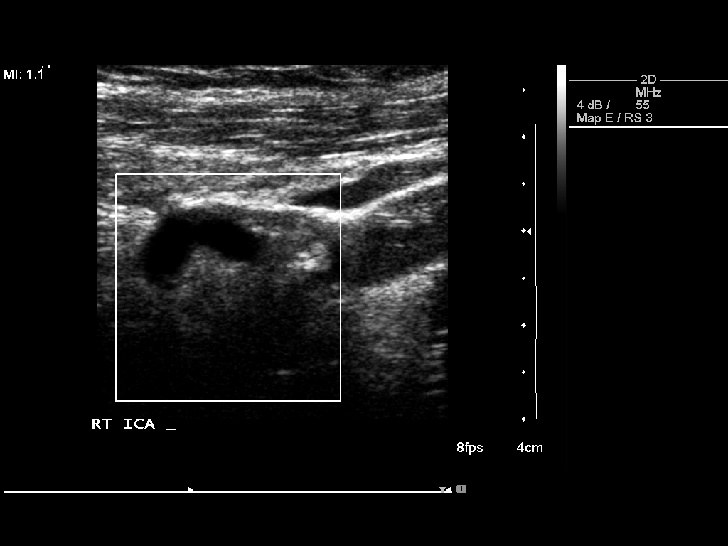
[im 21/37]
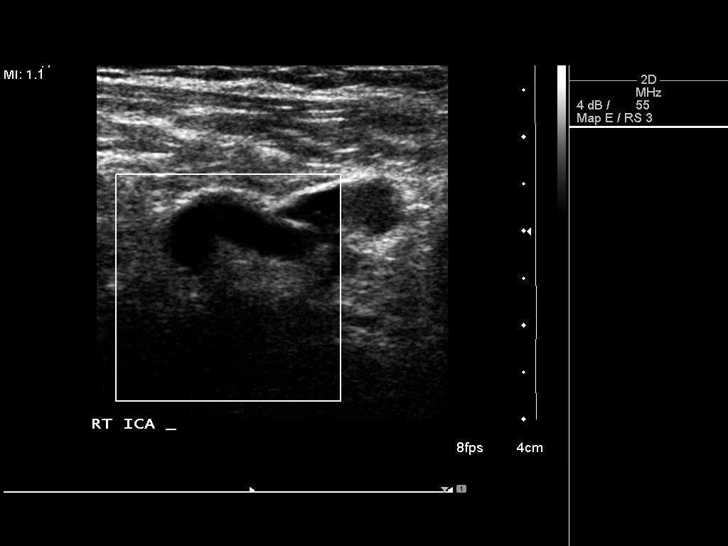
[im 24/37]
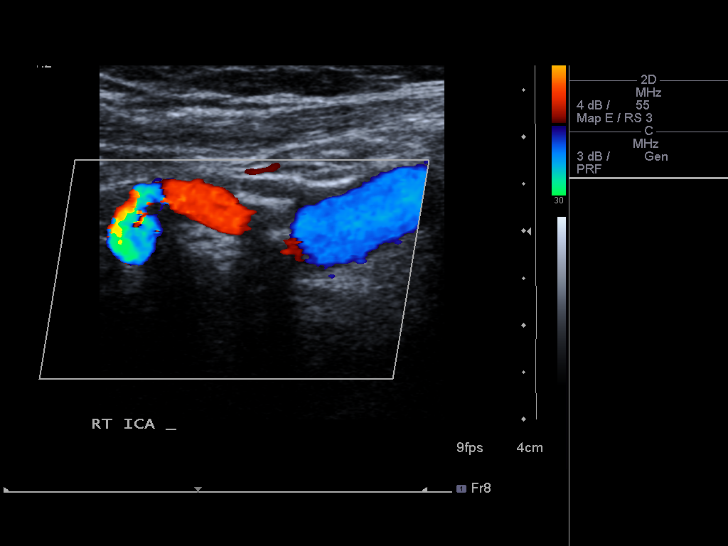
[im 27/37]
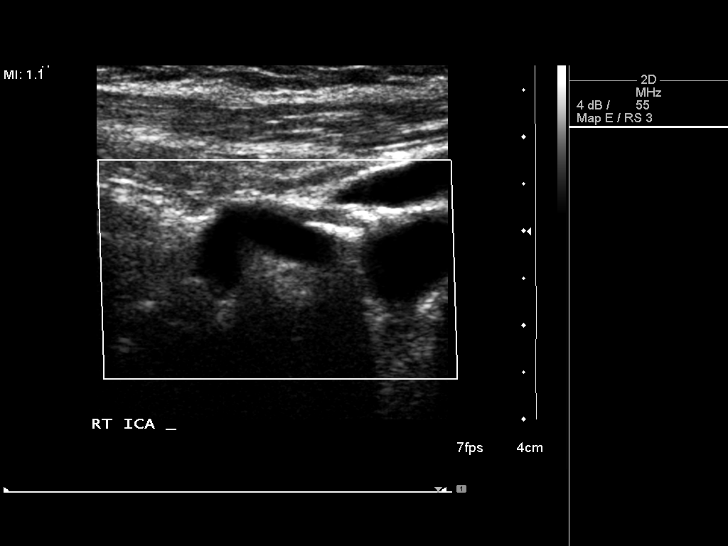
[im 30/37]
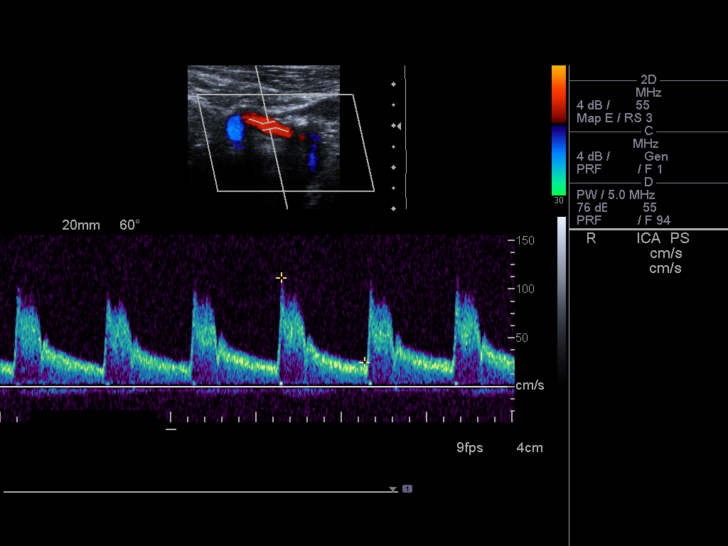
[im 33/37]
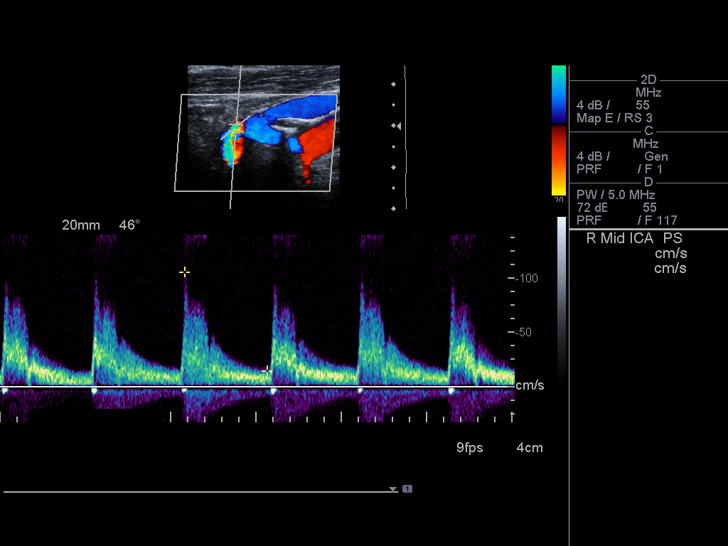
[im 37/37]
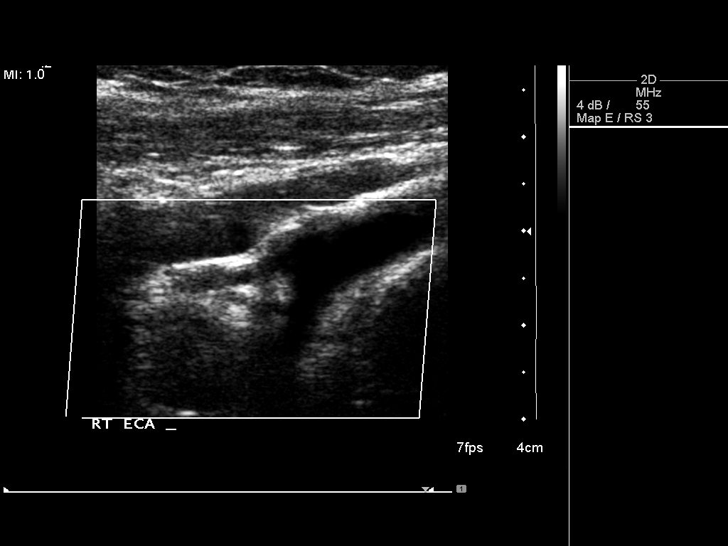

[13 of 24 positions shown; findings below may reference images not displayed]

FINDINGS: Criteria: Quantification of carotid stenosis is based on velocity
parameters that correlate the residual internal carotid diameter
with NASCET-based stenosis levels, using the diameter of the distal
internal carotid lumen as the denominator for stenosis measurement.

The following velocity measurements were obtained:

RIGHT

ICA:  107/25 cm/sec

CCA:  111/10 cm/sec

SYSTOLIC ICA/CCA RATIO:

DIASTOLIC ICA/CCA RATIO:

ECA:  68 cm/sec

LEFT

ICA:  72/21 cm/sec

CCA:  88/13 cm/sec

SYSTOLIC ICA/CCA RATIO:

DIASTOLIC ICA/CCA RATIO:

ECA:  59 cm/sec

RIGHT CAROTID ARTERY: There is a mild amount of calcified plaque at
the level of the bulb and proximal ICA. Estimated right ICA stenosis
is less than 50%.

RIGHT VERTEBRAL ARTERY: Antegrade flow with normal waveform and
velocity.

LEFT CAROTID ARTERY: There is a mild amount of partially calcified
plaque at the level of the bulb and proximal ICA. Estimated left ICA
stenosis is less than 50%

LEFT VERTEBRAL ARTERY: Antegrade flow with normal waveform and
velocity.
IMPRESSION: Mild plaque at the level of both carotid bulbs and proximal internal
carotid arteries. Estimated bilateral ICA stenoses are less than
50%.

## 2018-02-03 DIAGNOSIS — D1801 Hemangioma of skin and subcutaneous tissue: Secondary | ICD-10-CM | POA: Diagnosis not present

## 2018-02-03 DIAGNOSIS — L0889 Other specified local infections of the skin and subcutaneous tissue: Secondary | ICD-10-CM | POA: Diagnosis not present

## 2018-02-03 DIAGNOSIS — L603 Nail dystrophy: Secondary | ICD-10-CM | POA: Diagnosis not present

## 2018-02-03 DIAGNOSIS — L821 Other seborrheic keratosis: Secondary | ICD-10-CM | POA: Diagnosis not present

## 2018-02-05 ENCOUNTER — Other Ambulatory Visit: Payer: Self-pay | Admitting: Family Medicine

## 2018-02-05 DIAGNOSIS — Z1231 Encounter for screening mammogram for malignant neoplasm of breast: Secondary | ICD-10-CM

## 2018-02-18 DIAGNOSIS — H401132 Primary open-angle glaucoma, bilateral, moderate stage: Secondary | ICD-10-CM | POA: Diagnosis not present

## 2018-03-19 ENCOUNTER — Ambulatory Visit
Admission: RE | Admit: 2018-03-19 | Discharge: 2018-03-19 | Disposition: A | Discharge status: Home / Self Care | Payer: Medicare Other | Source: Ambulatory Visit | Attending: Family Medicine | Admitting: Family Medicine

## 2018-03-19 DIAGNOSIS — Z1231 Encounter for screening mammogram for malignant neoplasm of breast: Secondary | ICD-10-CM | POA: Diagnosis not present

## 2018-04-28 DIAGNOSIS — I10 Essential (primary) hypertension: Secondary | ICD-10-CM | POA: Diagnosis not present

## 2018-04-28 DIAGNOSIS — Z1211 Encounter for screening for malignant neoplasm of colon: Secondary | ICD-10-CM | POA: Diagnosis not present

## 2018-04-28 DIAGNOSIS — E785 Hyperlipidemia, unspecified: Secondary | ICD-10-CM | POA: Diagnosis not present

## 2018-04-28 DIAGNOSIS — R7309 Other abnormal glucose: Secondary | ICD-10-CM | POA: Diagnosis not present

## 2018-04-29 DIAGNOSIS — Z1211 Encounter for screening for malignant neoplasm of colon: Secondary | ICD-10-CM | POA: Diagnosis not present

## 2018-05-05 DIAGNOSIS — M1712 Unilateral primary osteoarthritis, left knee: Secondary | ICD-10-CM | POA: Diagnosis not present

## 2018-05-05 DIAGNOSIS — M1711 Unilateral primary osteoarthritis, right knee: Secondary | ICD-10-CM | POA: Diagnosis not present

## 2018-05-13 DIAGNOSIS — M1712 Unilateral primary osteoarthritis, left knee: Secondary | ICD-10-CM | POA: Diagnosis not present

## 2018-05-13 DIAGNOSIS — M1711 Unilateral primary osteoarthritis, right knee: Secondary | ICD-10-CM | POA: Diagnosis not present

## 2018-05-19 DIAGNOSIS — M1711 Unilateral primary osteoarthritis, right knee: Secondary | ICD-10-CM | POA: Diagnosis not present

## 2018-05-19 DIAGNOSIS — M1712 Unilateral primary osteoarthritis, left knee: Secondary | ICD-10-CM | POA: Diagnosis not present

## 2018-05-21 DIAGNOSIS — Z23 Encounter for immunization: Secondary | ICD-10-CM | POA: Diagnosis not present

## 2018-06-24 DIAGNOSIS — H401132 Primary open-angle glaucoma, bilateral, moderate stage: Secondary | ICD-10-CM | POA: Diagnosis not present

## 2018-07-29 DIAGNOSIS — H1089 Other conjunctivitis: Secondary | ICD-10-CM | POA: Diagnosis not present

## 2018-08-19 DIAGNOSIS — H00025 Hordeolum internum left lower eyelid: Secondary | ICD-10-CM | POA: Diagnosis not present

## 2018-10-21 DIAGNOSIS — H401132 Primary open-angle glaucoma, bilateral, moderate stage: Secondary | ICD-10-CM | POA: Diagnosis not present

## 2018-11-12 DIAGNOSIS — M25569 Pain in unspecified knee: Secondary | ICD-10-CM | POA: Diagnosis not present

## 2018-11-12 DIAGNOSIS — M8588 Other specified disorders of bone density and structure, other site: Secondary | ICD-10-CM | POA: Diagnosis not present

## 2018-11-12 DIAGNOSIS — Z0000 Encounter for general adult medical examination without abnormal findings: Secondary | ICD-10-CM | POA: Diagnosis not present

## 2018-11-12 DIAGNOSIS — E785 Hyperlipidemia, unspecified: Secondary | ICD-10-CM | POA: Diagnosis not present

## 2018-11-12 DIAGNOSIS — E039 Hypothyroidism, unspecified: Secondary | ICD-10-CM | POA: Diagnosis not present

## 2018-11-12 DIAGNOSIS — R7309 Other abnormal glucose: Secondary | ICD-10-CM | POA: Diagnosis not present

## 2018-11-12 DIAGNOSIS — I10 Essential (primary) hypertension: Secondary | ICD-10-CM | POA: Diagnosis not present

## 2019-02-07 ENCOUNTER — Other Ambulatory Visit: Payer: Self-pay | Admitting: Family Medicine

## 2019-02-07 DIAGNOSIS — Z1231 Encounter for screening mammogram for malignant neoplasm of breast: Secondary | ICD-10-CM

## 2019-03-29 ENCOUNTER — Other Ambulatory Visit: Payer: Self-pay

## 2019-03-29 ENCOUNTER — Ambulatory Visit
Admission: RE | Admit: 2019-03-29 | Discharge: 2019-03-29 | Disposition: A | Discharge status: Home / Self Care | Payer: Medicare Other | Source: Ambulatory Visit | Attending: Family Medicine | Admitting: Family Medicine

## 2019-03-29 DIAGNOSIS — Z1231 Encounter for screening mammogram for malignant neoplasm of breast: Secondary | ICD-10-CM

## 2019-05-13 DIAGNOSIS — L603 Nail dystrophy: Secondary | ICD-10-CM | POA: Diagnosis not present

## 2019-05-13 DIAGNOSIS — D485 Neoplasm of uncertain behavior of skin: Secondary | ICD-10-CM | POA: Diagnosis not present

## 2019-05-13 DIAGNOSIS — C44311 Basal cell carcinoma of skin of nose: Secondary | ICD-10-CM | POA: Diagnosis not present

## 2019-05-17 DIAGNOSIS — I10 Essential (primary) hypertension: Secondary | ICD-10-CM | POA: Diagnosis not present

## 2019-05-17 DIAGNOSIS — E039 Hypothyroidism, unspecified: Secondary | ICD-10-CM | POA: Diagnosis not present

## 2019-05-17 DIAGNOSIS — R7303 Prediabetes: Secondary | ICD-10-CM | POA: Diagnosis not present

## 2019-05-17 DIAGNOSIS — Z23 Encounter for immunization: Secondary | ICD-10-CM | POA: Diagnosis not present

## 2019-05-17 DIAGNOSIS — E785 Hyperlipidemia, unspecified: Secondary | ICD-10-CM | POA: Diagnosis not present

## 2019-05-31 DIAGNOSIS — C44311 Basal cell carcinoma of skin of nose: Secondary | ICD-10-CM | POA: Diagnosis not present

## 2019-05-31 DIAGNOSIS — Z85828 Personal history of other malignant neoplasm of skin: Secondary | ICD-10-CM | POA: Diagnosis not present

## 2019-06-21 ENCOUNTER — Other Ambulatory Visit: Payer: Self-pay

## 2019-06-21 DIAGNOSIS — Z20828 Contact with and (suspected) exposure to other viral communicable diseases: Secondary | ICD-10-CM | POA: Diagnosis not present

## 2019-06-21 DIAGNOSIS — Z20822 Contact with and (suspected) exposure to covid-19: Secondary | ICD-10-CM

## 2019-06-22 LAB — NOVEL CORONAVIRUS, NAA: SARS-CoV-2, NAA: NOT DETECTED

## 2019-07-17 IMAGING — CR DG CHEST 2V
2 series · 2 of 2 positions shown · non-contrast
Comparison: None.

CLINICAL DATA: Dry cough for 9 months

EXAM:
CHEST  2 VIEW

[w chest pa]
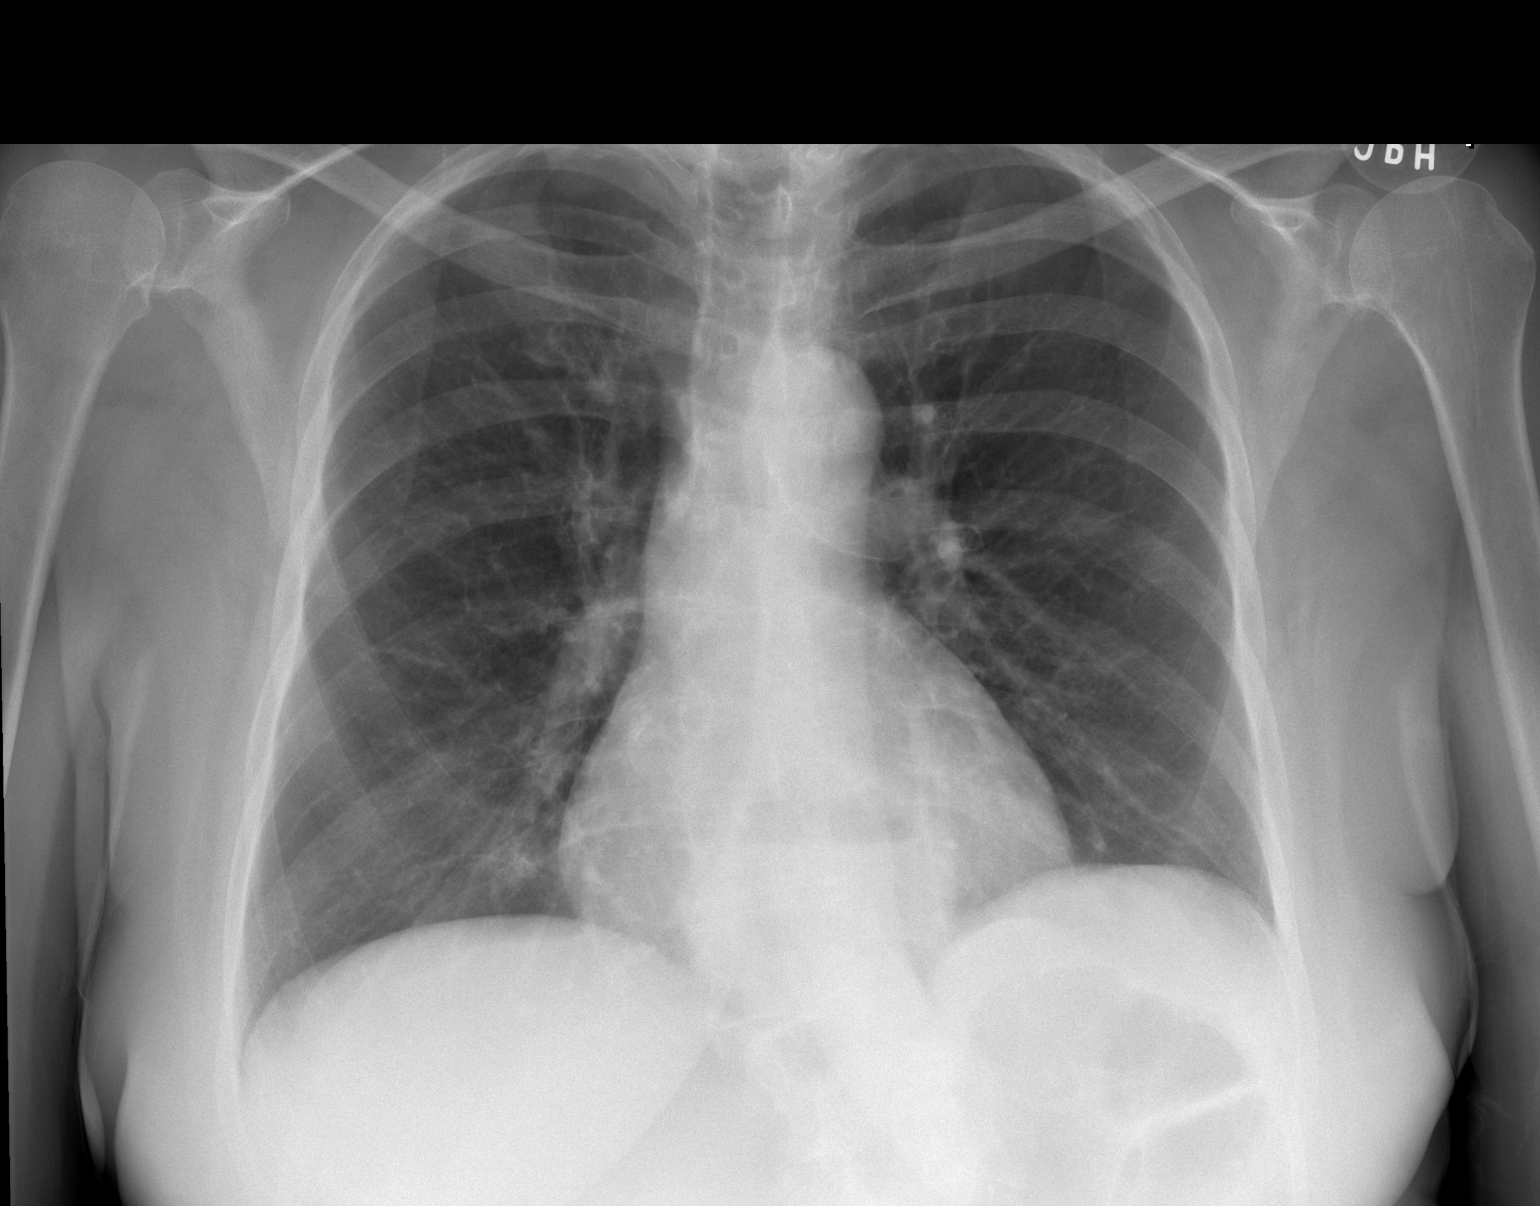

[w chest lat]
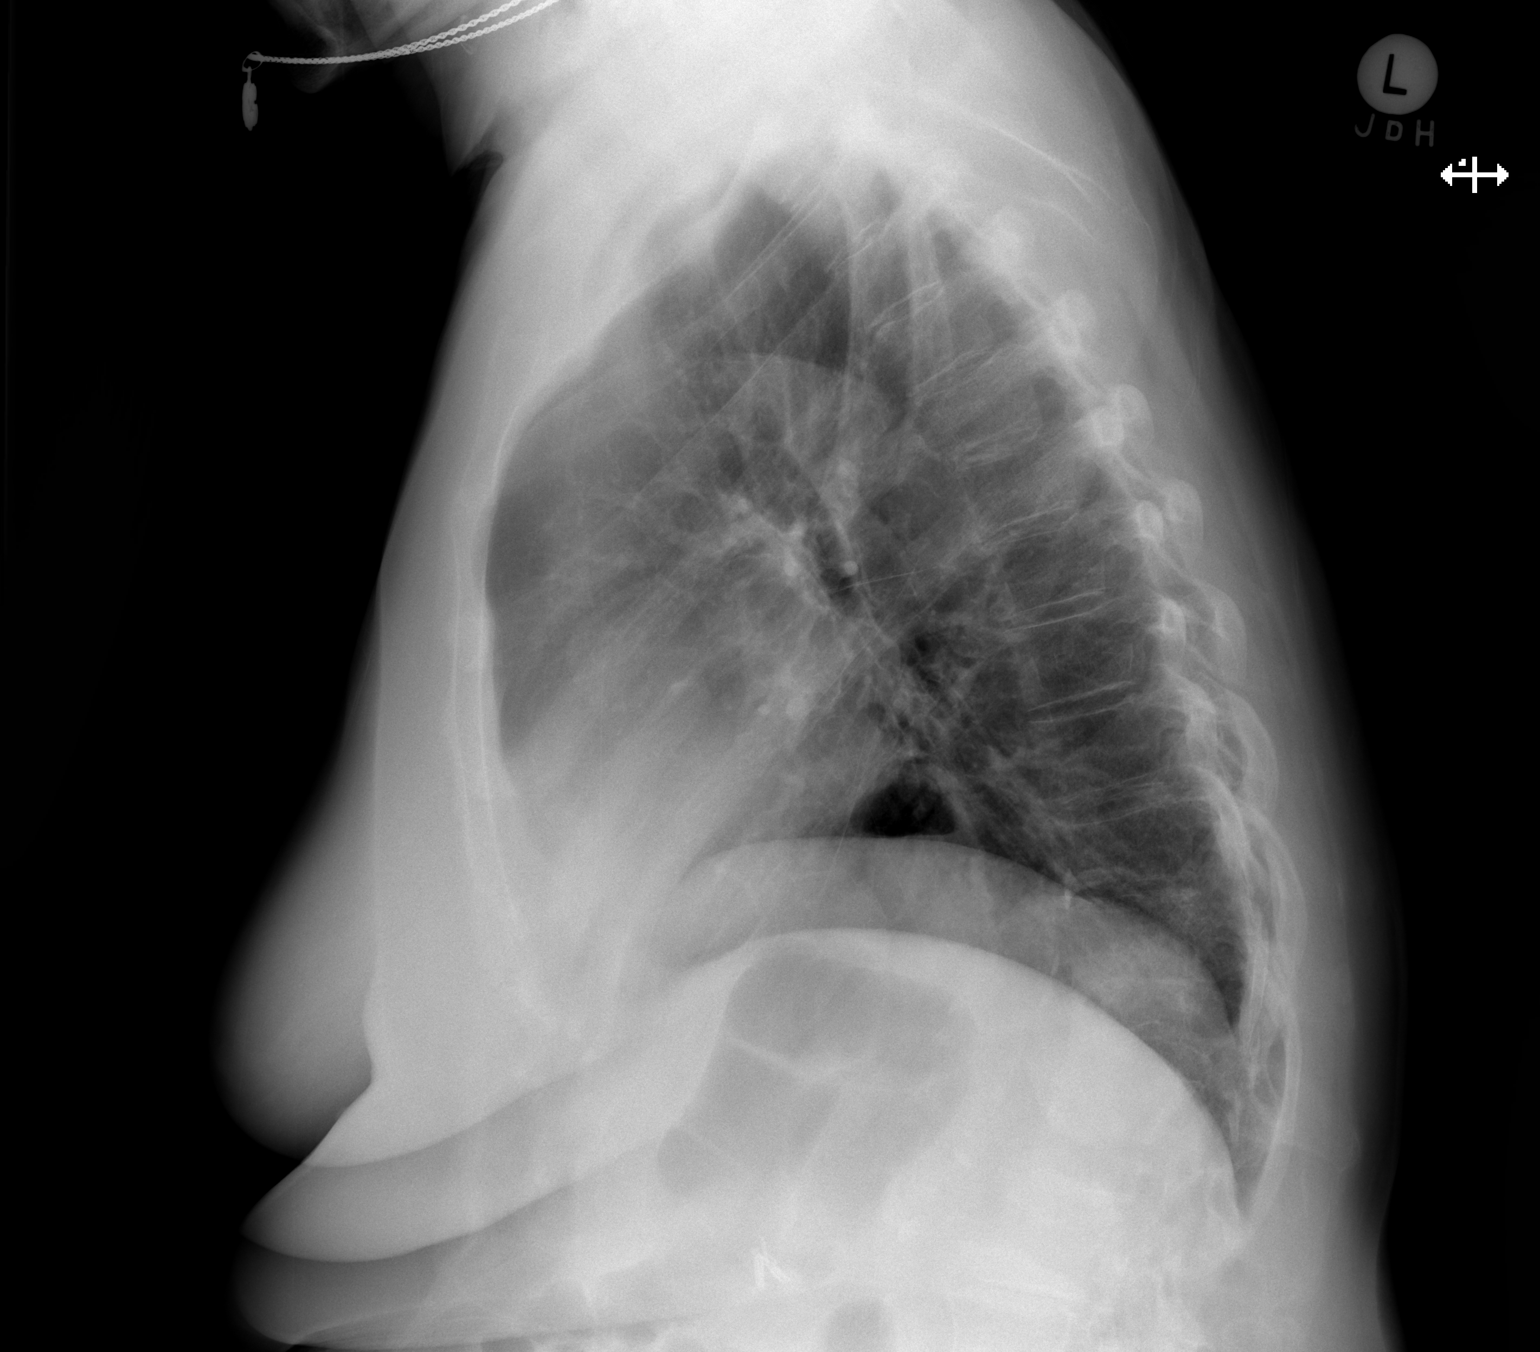

[2 of 2 positions shown; findings below may reference images not displayed]

FINDINGS: Normal mediastinum and cardiac silhouette. Normal pulmonary
vasculature. No evidence of effusion, infiltrate, or pneumothorax.
No acute bony abnormality.
IMPRESSION: No acute cardiopulmonary process.

## 2019-07-21 DIAGNOSIS — H401132 Primary open-angle glaucoma, bilateral, moderate stage: Secondary | ICD-10-CM | POA: Diagnosis not present

## 2019-07-27 ENCOUNTER — Other Ambulatory Visit: Payer: Self-pay

## 2019-07-27 DIAGNOSIS — Z20828 Contact with and (suspected) exposure to other viral communicable diseases: Secondary | ICD-10-CM | POA: Diagnosis not present

## 2019-07-27 DIAGNOSIS — Z20822 Contact with and (suspected) exposure to covid-19: Secondary | ICD-10-CM

## 2019-07-29 LAB — NOVEL CORONAVIRUS, NAA: SARS-CoV-2, NAA: NOT DETECTED

## 2019-11-09 DIAGNOSIS — Z23 Encounter for immunization: Secondary | ICD-10-CM | POA: Diagnosis not present

## 2019-11-21 DIAGNOSIS — Z0000 Encounter for general adult medical examination without abnormal findings: Secondary | ICD-10-CM | POA: Diagnosis not present

## 2019-11-21 DIAGNOSIS — E785 Hyperlipidemia, unspecified: Secondary | ICD-10-CM | POA: Diagnosis not present

## 2019-11-21 DIAGNOSIS — R7303 Prediabetes: Secondary | ICD-10-CM | POA: Diagnosis not present

## 2019-11-21 DIAGNOSIS — I10 Essential (primary) hypertension: Secondary | ICD-10-CM | POA: Diagnosis not present

## 2019-11-21 DIAGNOSIS — E039 Hypothyroidism, unspecified: Secondary | ICD-10-CM | POA: Diagnosis not present

## 2019-11-21 DIAGNOSIS — M8588 Other specified disorders of bone density and structure, other site: Secondary | ICD-10-CM | POA: Diagnosis not present

## 2019-11-22 DIAGNOSIS — Z0000 Encounter for general adult medical examination without abnormal findings: Secondary | ICD-10-CM | POA: Diagnosis not present

## 2019-11-22 DIAGNOSIS — E039 Hypothyroidism, unspecified: Secondary | ICD-10-CM | POA: Diagnosis not present

## 2019-11-22 DIAGNOSIS — M8588 Other specified disorders of bone density and structure, other site: Secondary | ICD-10-CM | POA: Diagnosis not present

## 2019-11-22 DIAGNOSIS — E785 Hyperlipidemia, unspecified: Secondary | ICD-10-CM | POA: Diagnosis not present

## 2019-11-22 DIAGNOSIS — R7303 Prediabetes: Secondary | ICD-10-CM | POA: Diagnosis not present

## 2019-12-13 DIAGNOSIS — Z23 Encounter for immunization: Secondary | ICD-10-CM | POA: Diagnosis not present

## 2020-02-24 ENCOUNTER — Other Ambulatory Visit: Payer: Self-pay | Admitting: Family Medicine

## 2020-02-24 DIAGNOSIS — Z1231 Encounter for screening mammogram for malignant neoplasm of breast: Secondary | ICD-10-CM

## 2020-02-24 DIAGNOSIS — H401132 Primary open-angle glaucoma, bilateral, moderate stage: Secondary | ICD-10-CM | POA: Diagnosis not present

## 2020-03-29 ENCOUNTER — Ambulatory Visit
Admission: RE | Admit: 2020-03-29 | Discharge: 2020-03-29 | Disposition: A | Discharge status: Home / Self Care | Payer: Medicare Other | Source: Ambulatory Visit | Attending: Family Medicine | Admitting: Family Medicine

## 2020-03-29 ENCOUNTER — Other Ambulatory Visit: Payer: Self-pay

## 2020-03-29 DIAGNOSIS — Z1231 Encounter for screening mammogram for malignant neoplasm of breast: Secondary | ICD-10-CM | POA: Diagnosis not present

## 2020-04-06 DIAGNOSIS — L920 Granuloma annulare: Secondary | ICD-10-CM | POA: Diagnosis not present

## 2020-04-06 DIAGNOSIS — Z85828 Personal history of other malignant neoplasm of skin: Secondary | ICD-10-CM | POA: Diagnosis not present

## 2020-05-03 DIAGNOSIS — M25562 Pain in left knee: Secondary | ICD-10-CM | POA: Diagnosis not present

## 2020-05-03 DIAGNOSIS — M170 Bilateral primary osteoarthritis of knee: Secondary | ICD-10-CM | POA: Diagnosis not present

## 2020-05-03 DIAGNOSIS — M1711 Unilateral primary osteoarthritis, right knee: Secondary | ICD-10-CM | POA: Diagnosis not present

## 2020-05-03 DIAGNOSIS — M25561 Pain in right knee: Secondary | ICD-10-CM | POA: Diagnosis not present

## 2020-05-03 DIAGNOSIS — M1712 Unilateral primary osteoarthritis, left knee: Secondary | ICD-10-CM | POA: Diagnosis not present

## 2020-05-15 DIAGNOSIS — M25561 Pain in right knee: Secondary | ICD-10-CM | POA: Diagnosis not present

## 2020-05-15 DIAGNOSIS — M25562 Pain in left knee: Secondary | ICD-10-CM | POA: Diagnosis not present

## 2020-05-15 DIAGNOSIS — M1711 Unilateral primary osteoarthritis, right knee: Secondary | ICD-10-CM | POA: Diagnosis not present

## 2020-05-15 DIAGNOSIS — M1712 Unilateral primary osteoarthritis, left knee: Secondary | ICD-10-CM | POA: Diagnosis not present

## 2020-05-15 DIAGNOSIS — M170 Bilateral primary osteoarthritis of knee: Secondary | ICD-10-CM | POA: Diagnosis not present

## 2020-05-22 DIAGNOSIS — M25562 Pain in left knee: Secondary | ICD-10-CM | POA: Diagnosis not present

## 2020-05-22 DIAGNOSIS — M170 Bilateral primary osteoarthritis of knee: Secondary | ICD-10-CM | POA: Diagnosis not present

## 2020-05-22 DIAGNOSIS — M25561 Pain in right knee: Secondary | ICD-10-CM | POA: Diagnosis not present

## 2020-05-23 DIAGNOSIS — I10 Essential (primary) hypertension: Secondary | ICD-10-CM | POA: Diagnosis not present

## 2020-05-23 DIAGNOSIS — Z23 Encounter for immunization: Secondary | ICD-10-CM | POA: Diagnosis not present

## 2020-05-23 DIAGNOSIS — E039 Hypothyroidism, unspecified: Secondary | ICD-10-CM | POA: Diagnosis not present

## 2020-05-23 DIAGNOSIS — E785 Hyperlipidemia, unspecified: Secondary | ICD-10-CM | POA: Diagnosis not present

## 2020-05-23 DIAGNOSIS — I6529 Occlusion and stenosis of unspecified carotid artery: Secondary | ICD-10-CM | POA: Diagnosis not present

## 2020-05-23 DIAGNOSIS — R7303 Prediabetes: Secondary | ICD-10-CM | POA: Diagnosis not present

## 2020-05-24 DIAGNOSIS — Z23 Encounter for immunization: Secondary | ICD-10-CM | POA: Diagnosis not present

## 2020-05-29 DIAGNOSIS — M25562 Pain in left knee: Secondary | ICD-10-CM | POA: Diagnosis not present

## 2020-05-29 DIAGNOSIS — M170 Bilateral primary osteoarthritis of knee: Secondary | ICD-10-CM | POA: Diagnosis not present

## 2020-05-29 DIAGNOSIS — M25561 Pain in right knee: Secondary | ICD-10-CM | POA: Diagnosis not present

## 2020-07-03 DIAGNOSIS — Z23 Encounter for immunization: Secondary | ICD-10-CM | POA: Diagnosis not present

## 2020-07-23 DIAGNOSIS — H2511 Age-related nuclear cataract, right eye: Secondary | ICD-10-CM | POA: Diagnosis not present

## 2020-07-23 DIAGNOSIS — H401131 Primary open-angle glaucoma, bilateral, mild stage: Secondary | ICD-10-CM | POA: Diagnosis not present

## 2020-07-23 DIAGNOSIS — H2513 Age-related nuclear cataract, bilateral: Secondary | ICD-10-CM | POA: Diagnosis not present

## 2020-08-09 DIAGNOSIS — J22 Unspecified acute lower respiratory infection: Secondary | ICD-10-CM | POA: Diagnosis not present

## 2020-08-09 DIAGNOSIS — Z03818 Encounter for observation for suspected exposure to other biological agents ruled out: Secondary | ICD-10-CM | POA: Diagnosis not present

## 2020-08-09 DIAGNOSIS — R059 Cough, unspecified: Secondary | ICD-10-CM | POA: Diagnosis not present

## 2020-08-10 DIAGNOSIS — R059 Cough, unspecified: Secondary | ICD-10-CM | POA: Diagnosis not present

## 2020-08-10 DIAGNOSIS — R0602 Shortness of breath: Secondary | ICD-10-CM | POA: Diagnosis not present

## 2020-08-13 ENCOUNTER — Other Ambulatory Visit (HOSPITAL_COMMUNITY): Payer: Self-pay | Admitting: Family Medicine

## 2020-08-13 DIAGNOSIS — R7989 Other specified abnormal findings of blood chemistry: Secondary | ICD-10-CM

## 2020-08-13 DIAGNOSIS — R0602 Shortness of breath: Secondary | ICD-10-CM

## 2020-08-16 ENCOUNTER — Ambulatory Visit (HOSPITAL_COMMUNITY)
Admission: RE | Admit: 2020-08-16 | Discharge: 2020-08-16 | Disposition: A | Discharge status: Home / Self Care | Payer: Medicare Other | Source: Ambulatory Visit | Attending: Family Medicine | Admitting: Family Medicine

## 2020-08-16 ENCOUNTER — Other Ambulatory Visit: Payer: Self-pay

## 2020-08-16 DIAGNOSIS — R0602 Shortness of breath: Secondary | ICD-10-CM

## 2020-08-16 DIAGNOSIS — R7989 Other specified abnormal findings of blood chemistry: Secondary | ICD-10-CM | POA: Diagnosis not present

## 2020-08-16 DIAGNOSIS — E079 Disorder of thyroid, unspecified: Secondary | ICD-10-CM | POA: Diagnosis not present

## 2020-08-16 DIAGNOSIS — I10 Essential (primary) hypertension: Secondary | ICD-10-CM | POA: Diagnosis not present

## 2020-08-16 DIAGNOSIS — E785 Hyperlipidemia, unspecified: Secondary | ICD-10-CM | POA: Diagnosis not present

## 2020-08-16 LAB — ECHOCARDIOGRAM COMPLETE
Area-P 1/2: 4.39 cm2
S' Lateral: 3 cm

## 2020-08-16 NOTE — Progress Notes (Signed)
  Echocardiogram 2D Echocardiogram has been performed.  Darlina Sicilian M 08/16/2020, 11:42 AM

## 2020-08-21 DIAGNOSIS — I5032 Chronic diastolic (congestive) heart failure: Secondary | ICD-10-CM | POA: Diagnosis not present

## 2020-08-23 DIAGNOSIS — H2511 Age-related nuclear cataract, right eye: Secondary | ICD-10-CM | POA: Diagnosis not present

## 2020-08-23 DIAGNOSIS — H401111 Primary open-angle glaucoma, right eye, mild stage: Secondary | ICD-10-CM | POA: Diagnosis not present

## 2020-08-24 DIAGNOSIS — H2512 Age-related nuclear cataract, left eye: Secondary | ICD-10-CM | POA: Diagnosis not present

## 2020-09-06 DIAGNOSIS — H2512 Age-related nuclear cataract, left eye: Secondary | ICD-10-CM | POA: Diagnosis not present

## 2020-09-06 DIAGNOSIS — H401121 Primary open-angle glaucoma, left eye, mild stage: Secondary | ICD-10-CM | POA: Diagnosis not present

## 2020-09-06 DIAGNOSIS — H401122 Primary open-angle glaucoma, left eye, moderate stage: Secondary | ICD-10-CM | POA: Diagnosis not present

## 2020-09-13 DIAGNOSIS — H2512 Age-related nuclear cataract, left eye: Secondary | ICD-10-CM | POA: Diagnosis not present

## 2020-09-17 ENCOUNTER — Ambulatory Visit (INDEPENDENT_AMBULATORY_CARE_PROVIDER_SITE_OTHER): Payer: Medicare Other | Admitting: Internal Medicine

## 2020-09-17 ENCOUNTER — Encounter: Payer: Self-pay | Admitting: Internal Medicine

## 2020-09-17 ENCOUNTER — Other Ambulatory Visit: Payer: Self-pay

## 2020-09-17 VITALS — BP 150/60 | HR 57 | Ht 62.5 in | Wt 124.4 lb

## 2020-09-17 DIAGNOSIS — E782 Mixed hyperlipidemia: Secondary | ICD-10-CM

## 2020-09-17 DIAGNOSIS — I10 Essential (primary) hypertension: Secondary | ICD-10-CM | POA: Insufficient documentation

## 2020-09-17 DIAGNOSIS — R0602 Shortness of breath: Secondary | ICD-10-CM | POA: Diagnosis not present

## 2020-09-17 DIAGNOSIS — I5032 Chronic diastolic (congestive) heart failure: Secondary | ICD-10-CM | POA: Diagnosis not present

## 2020-09-17 DIAGNOSIS — R0609 Other forms of dyspnea: Secondary | ICD-10-CM | POA: Insufficient documentation

## 2020-09-17 NOTE — Progress Notes (Signed)
Cardiology Office Note:    Date:  09/17/2020   ID:  Carmen Todd, DOB 15-Jun-1941, MRN [id number]  PCP:  London Pepper, MD  [facility] Cardiologist:  No primary care provider on file.  [facility] Electrophysiologist:  None   CC: new HFpEF Consulted for the evaluation of HFpEF at the behest of London Pepper, MD  History of Present Illness:    Carmen Todd is a 80 y.o. female with a hx of HTN, HLD, Carotid Artery Stenosis with new HFpEF who presents for evaluation.  Patient notes that she was feeling a bad cold and bronchitis in December.  As part of her evaluation she had elevated BNP and echocardiogram.  She does note that while her cough and SOB have improved with antibiotics (Z pack) that she does have residual DOE .  Has had no chest pain, chest pressure, chest tightness, chest stinging DOE occurs with significant exertion, she is able to vacuum, take care of her house, and do chores without SOB.  No shortness of breath at rest.  No PND or orthopnea.  No bendopnea, weight gain, leg swelling , or abdominal swelling.  Notes near syncope with significant exertion.  Notes no palpitations or funny heart beats.     Patient reports prior cardiac testing including 2021 echo and stress test 9 years ago in [place] that was ok.    No history of pre-eclampsia or prematurity.    Ambulatory SBP 130.   Past Medical History:  Diagnosis Date  . Hyperlipidemia   . Hypertension   . Thyroid disease     Past Surgical History:  Procedure Laterality Date  . APPENDECTOMY    . EYE SURGERY      Current Medications: Current Meds  Medication Sig  . amLODipine (NORVASC) 5 MG tablet   . aspirin EC 81 MG tablet Take 81 mg by mouth daily.  Marland Kitchen atenolol (TENORMIN) 25 MG tablet   . atorvastatin (LIPITOR) 40 MG tablet Take 40 mg by mouth daily.  . DORZOLAMIDE HCL OP Apply 1 drop to eye 2 (two) times daily. Rt eye only  . lansoprazole (PREVACID) 15 MG capsule Take 15 mg by mouth daily at 12  noon.  Marland Kitchen levothyroxine (SYNTHROID, LEVOTHROID) 100 MCG tablet Take 100 mcg by mouth daily before breakfast.  . Multiple Vitamins-Minerals (MULTIVITAMIN WITH MINERALS) tablet Take 1 tablet by mouth daily.  Marland Kitchen olmesartan (BENICAR) 20 MG tablet Take 20 mg by mouth daily.  . timolol (TIMOPTIC) 0.5 % ophthalmic solution   . Vitamin D, Cholecalciferol, 1000 units CAPS Take 1 capsule by mouth daily.     Allergies:   Alendronate, Lisinopril, Hctz [hydrochlorothiazide], Inderal [propranolol], Morphine and related, and Penicillins   Social History   Socioeconomic History  . Marital status: Widowed    Spouse name: Not on file  . Number of children: Not on file  . Years of education: Not on file  . Highest education level: Not on file  Occupational History  . Not on file  Tobacco Use  . Smoking status: Former Smoker    Packs/day: 0.50    Years: 16.00    Pack years: 8.00    Types: Cigarettes    Quit date: 1972    Years since quitting: 50.0  . Smokeless tobacco: Never Used  Substance and Sexual Activity  . Alcohol use: Not on file  . Drug use: Not on file  . Sexual activity: Not on file  Other Topics Concern  . Not on file  Social History  Narrative  . Not on file   Social Determinants of Health   Financial Resource Strain: Not on file  Food Insecurity: Not on file  Transportation Needs: Not on file  Physical Activity: Not on file  Stress: Not on file  Social Connections: Not on file    From [place]  Family History: History of coronary artery disease notable for grandparents. History of heart failure notable for grandfather. History of arrhythmia notable for no members.  ROS:   Please see the history of present illness.     All other systems reviewed and are negative.  EKGs/Labs/Other Studies Reviewed:    The following studies were reviewed today:  EKG:   09/17/20: Sinus Bradycardia rate 57 borderline anterior infarct pattern  Transthoracic Echocardiogram: Date:  08/16/2020 Results: diminished tissue Doppler, estimated LA pressure 18 mm Hg IMPRESSIONS  1. Left ventricular ejection fraction, by estimation, is 60 to 65%. The  left ventricle has normal function. The left ventricle has no regional  wall motion abnormalities. Left ventricular diastolic parameters are  consistent with Grade II diastolic  dysfunction (pseudonormalization).  2. Right ventricular systolic function is normal. The right ventricular  size is normal. There is normal pulmonary artery systolic pressure.  3. Left atrial size was moderately dilated.  4. Right atrial size was mildly dilated.  5. The mitral valve is normal in structure. Mild mitral valve  regurgitation.  6. The aortic valve is tricuspid. There is mild calcification of the  aortic valve. Aortic valve regurgitation is not visualized. Mild aortic  valve sclerosis is present, with no evidence of aortic valve stenosis.  7. The inferior vena cava is normal in size with greater than 50%  respiratory variability, suggesting right atrial pressure of 3 mmHg.   Recent Labs: No results found for requested labs within last 8760 hours.  Recent Lipid Panel No results found for: CHOL, TRIG, HDL, CHOLHDL, VLDL, LDLCALC, LDLDIRECT  [facility] Labs 11/22/2019:   Cholesterol  Total 144 HDL 51 LDL 71 TG 118 K 3.9 Creatinine 0.67  Risk Assessment/Calculations:     N?A  Physical Exam:    VS:  BP (!) 150/60   Pulse (!) 57   Ht 5' 2.5" (1.588 m)   Wt 124 lb 6.4 oz (56.4 kg)   SpO2 96%   BMI 22.39 kg/m     Wt Readings from Last 3 Encounters:  09/17/20 124 lb 6.4 oz (56.4 kg)  05/13/17 141 lb (64 kg)    GEN:  Well nourished, well developed in no acute distress HEENT: Normal NECK: No JVD; No carotid bruits LYMPHATICS: No lymphadenopathy CARDIAC: RRR, no murmurs, rubs, gallops RESPIRATORY:  Clear to auscultation without rales, wheezing or rhonchi  ABDOMEN: Soft, non-tender, non-distended MUSCULOSKELETAL:  No edema;  No deformity  SKIN: Warm and dry NEUROLOGIC:  Alert and oriented x 3 PSYCHIATRIC:  Normal affect   ASSESSMENT:    1. Shortness of breath   2. Chronic heart failure with preserved ejection fraction ([place])   3. Essential hypertension   4. Mixed hyperlipidemia    PLAN:    In order of problems listed above:  Heart Failure Preserved Ejection Fraction  DOE HTN, HLD - NYHA class I, Stage II, euvolemic, etiology from HTN - Diuretic regimen: euvolemic - Strict I/Os, daily weights, and fluid restriction of < 2 L  - Replace electrolytes PRN and keep K>4 and Mg>2. - continue olmesartan, norvasc, and atenolol - given DOE will also order NM Stress test (pharmacologic) - low threshold to start  Aldactone or  SGLT2i - lipids at goal - ambulatory blood pressure not done, will start ambulatory BP monitoring; gave education on how to perform ambulatory blood pressure monitoring including the frequency and technique; goal ambulatory blood pressure < 135/85 on average    Shared Decision Making/Informed Consent The risks [chest pain, shortness of breath, cardiac arrhythmias, dizziness, blood pressure fluctuations, myocardial infarction, stroke/transient ischemic attack, nausea, vomiting, allergic reaction, radiation exposure, metallic taste sensation and life-threatening complications (estimated to be 1 in 10,000)], benefits (risk stratification, diagnosing coronary artery disease, treatment guidance) and alternatives of a nuclear stress test were discussed in detail with Ms. Cure and she agrees to proceed.       Medication Adjustments/Labs and Tests Ordered: Current medicines are reviewed at length with the patient today.  Concerns regarding medicines are outlined above.  Orders Placed This Encounter  Procedures  . MYOCARDIAL PERFUSION IMAGING  . EKG 12-Lead   No orders of the defined types were placed in this encounter.   Patient Instructions  Medication Instructions:  Your physician  recommends that you continue on your current medications as directed. Please refer to the Current Medication list given to you today.  *If you need a refill on your cardiac medications before your next appointment, please call your pharmacy*   Lab Work: None ordered If you have labs (blood work) drawn today and your tests are completely normal, you will receive your results only by: Marland Kitchen MyChart Message (if you have MyChart) OR . A paper copy in the mail If you have any lab test that is abnormal or we need to change your treatment, we will call you to review the results.   Testing/Procedures: Your physician has requested that you have a lexiscan myoview. For further information please visit [email or web address]. Please follow instruction sheet BELOW:    You are scheduled for a Myocardial Perfusion Imaging Study. Please arrive 15 minutes prior to your appointment time for registration and insurance purposes.  The test will take approximately 3 to 4 hours to complete; you may bring reading material.  If someone comes with you to your appointment, they will need to remain in the main lobby due to limited space in the testing area. **If you are pregnant or breastfeeding, please notify the nuclear lab prior to your appointment**  How to prepare for your Myocardial Perfusion Test: . Do not eat or drink 3 hours prior to your test, except you may have water. . Do not consume products containing caffeine (regular or decaffeinated) 12 hours prior to your test. (ex: coffee, chocolate, sodas, tea). . Do bring a list of your current medications with you.  If not listed below, you may take your medications as normal. . Do wear comfortable clothes (no dresses or overalls) and walking shoes, tennis shoes preferred (No heels or open toe shoes are allowed). . Do NOT wear cologne, perfume, aftershave, or lotions (deodorant is allowed). . If these instructions are not followed, your test will have to be  rescheduled.     Follow-Up: At [facility], you and your health needs are our priority.  As part of our continuing mission to provide you with exceptional heart care, we have created designated Provider Care Teams.  These Care Teams include your primary Cardiologist (physician) and Advanced Practice Providers (APPs -  Physician Assistants and Nurse Practitioners) who all work together to provide you with the care you need, when you need it.  We recommend signing up for the patient portal called "MyChart".  Sign up information is provided on this After Visit Summary.  MyChart is used to connect with patients for Virtual Visits (Telemedicine).  Patients are able to view lab/test results, encounter notes, upcoming appointments, etc.  Non-urgent messages can be sent to your provider as well.   To learn more about what you can do with MyChart, go to [email or web address].    Your next appointment:   3 month(s)  The format for your next appointment:   In Person  Provider:   Rudean Haskell, MD   Other Instructions Monitor your blood pressure.  If you notice it is running higher than 135/85 consistently, please let us know.      Signed, Werner Lean, MD  09/17/2020 9:20 AM    [place]

## 2020-09-17 NOTE — Patient Instructions (Signed)
Medication Instructions:  Your physician recommends that you continue on your current medications as directed. Please refer to the Current Medication list given to you today.  *If you need a refill on your cardiac medications before your next appointment, please call your pharmacy*   Lab Work: None ordered If you have labs (blood work) drawn today and your tests are completely normal, you will receive your results only by: Marland Kitchen MyChart Message (if you have MyChart) OR . A paper copy in the mail If you have any lab test that is abnormal or we need to change your treatment, we will call you to review the results.   Testing/Procedures: Your physician has requested that you have a lexiscan myoview. For further information please visit [email or web address]. Please follow instruction sheet BELOW:    You are scheduled for a Myocardial Perfusion Imaging Study. Please arrive 15 minutes prior to your appointment time for registration and insurance purposes.  The test will take approximately 3 to 4 hours to complete; you may bring reading material.  If someone comes with you to your appointment, they will need to remain in the main lobby due to limited space in the testing area. **If you are pregnant or breastfeeding, please notify the nuclear lab prior to your appointment**  How to prepare for your Myocardial Perfusion Test: . Do not eat or drink 3 hours prior to your test, except you may have water. . Do not consume products containing caffeine (regular or decaffeinated) 12 hours prior to your test. (ex: coffee, chocolate, sodas, tea). . Do bring a list of your current medications with you.  If not listed below, you may take your medications as normal. . Do wear comfortable clothes (no dresses or overalls) and walking shoes, tennis shoes preferred (No heels or open toe shoes are allowed). . Do NOT wear cologne, perfume, aftershave, or lotions (deodorant is allowed). . If these instructions are not  followed, your test will have to be rescheduled.     Follow-Up: At [facility], you and your health needs are our priority.  As part of our continuing mission to provide you with exceptional heart care, we have created designated Provider Care Teams.  These Care Teams include your primary Cardiologist (physician) and Advanced Practice Providers (APPs -  Physician Assistants and Nurse Practitioners) who all work together to provide you with the care you need, when you need it.  We recommend signing up for the patient portal called "MyChart".  Sign up information is provided on this After Visit Summary.  MyChart is used to connect with patients for Virtual Visits (Telemedicine).  Patients are able to view lab/test results, encounter notes, upcoming appointments, etc.  Non-urgent messages can be sent to your provider as well.   To learn more about what you can do with MyChart, go to [email or web address].    Your next appointment:   3 month(s)  The format for your next appointment:   In Person  Provider:   Rudean Haskell, MD   Other Instructions Monitor your blood pressure.  If you notice it is running higher than 135/85 consistently, please let us know.

## 2020-09-19 NOTE — Addendum Note (Signed)
Addended by: Gaetano Net on: 09/19/2020 09:20 AM   Modules accepted: Orders

## 2020-09-19 NOTE — Addendum Note (Signed)
Addended by: Jiovanni Heeter A on: 09/19/2020 10:28 AM   Modules accepted: Orders  

## 2020-09-20 ENCOUNTER — Telehealth (HOSPITAL_COMMUNITY): Payer: Self-pay

## 2020-09-20 NOTE — Telephone Encounter (Signed)
Spoke with the patient, detailed instructions given. She stated she understood and would be here for her test. Asked to call back with any questions. S.Larnell Granlund EMT

## 2020-09-25 ENCOUNTER — Encounter (HOSPITAL_COMMUNITY): Payer: Medicare Other

## 2020-09-26 ENCOUNTER — Telehealth (HOSPITAL_COMMUNITY): Payer: Self-pay | Admitting: *Deleted

## 2020-09-26 NOTE — Telephone Encounter (Signed)
Patient given detailed instructions per Myocardial Perfusion Study Information Sheet for the test on 10/03/2020 at 1015. Patient notified to arrive 15 minutes early and that it is imperative to arrive on time for appointment to keep from having the test rescheduled.  If you need to cancel or reschedule your appointment, please call the office within 24 hours of your appointment. . Patient verbalized understanding.Carmen Todd, [name] Patient has Press photographer already

## 2020-10-03 ENCOUNTER — Other Ambulatory Visit: Payer: Self-pay

## 2020-10-03 ENCOUNTER — Ambulatory Visit (HOSPITAL_COMMUNITY): Payer: Medicare Other | Attending: Cardiology

## 2020-10-03 VITALS — Ht 62.0 in | Wt 124.0 lb

## 2020-10-03 DIAGNOSIS — R110 Nausea: Secondary | ICD-10-CM | POA: Diagnosis not present

## 2020-10-03 DIAGNOSIS — R0602 Shortness of breath: Secondary | ICD-10-CM | POA: Insufficient documentation

## 2020-10-03 LAB — MYOCARDIAL PERFUSION IMAGING
LV dias vol: 49 mL (ref 46–106)
LV sys vol: 17 mL
Peak HR: 78 {beats}/min
Rest HR: 60 {beats}/min
SDS: 9
SRS: 1
SSS: 10
TID: 1.04

## 2020-10-03 MED ORDER — TECHNETIUM TC 99M TETROFOSMIN IV KIT
10.9000 | PACK | Freq: Once | INTRAVENOUS | Status: AC | PRN
  Administered 2020-10-03: 10.9 via INTRAVENOUS
  Filled 2020-10-03: qty 11

## 2020-10-03 MED ORDER — REGADENOSON 0.4 MG/5ML IV SOLN
0.4000 mg | Freq: Once | INTRAVENOUS | Status: AC
  Administered 2020-10-03: 0.4 mg via INTRAVENOUS

## 2020-10-03 MED ORDER — AMINOPHYLLINE 25 MG/ML IV SOLN
75.0000 mg | Freq: Once | INTRAVENOUS | Status: AC
  Administered 2020-10-03: 75 mg via INTRAVENOUS

## 2020-10-03 MED ORDER — TECHNETIUM TC 99M TETROFOSMIN IV KIT
33.0000 | PACK | Freq: Once | INTRAVENOUS | Status: AC | PRN
  Administered 2020-10-03: 33 via INTRAVENOUS
  Filled 2020-10-03: qty 33

## 2020-10-04 ENCOUNTER — Telehealth: Payer: Self-pay | Admitting: Cardiovascular Disease

## 2020-10-04 NOTE — Telephone Encounter (Signed)
     I went in pt;s chart to see who called her

## 2020-10-25 DIAGNOSIS — H401132 Primary open-angle glaucoma, bilateral, moderate stage: Secondary | ICD-10-CM | POA: Diagnosis not present

## 2020-11-14 DIAGNOSIS — L920 Granuloma annulare: Secondary | ICD-10-CM | POA: Diagnosis not present

## 2020-11-14 DIAGNOSIS — L603 Nail dystrophy: Secondary | ICD-10-CM | POA: Diagnosis not present

## 2020-11-14 DIAGNOSIS — Z85828 Personal history of other malignant neoplasm of skin: Secondary | ICD-10-CM | POA: Diagnosis not present

## 2020-11-14 DIAGNOSIS — L03011 Cellulitis of right finger: Secondary | ICD-10-CM | POA: Diagnosis not present

## 2020-12-17 NOTE — Progress Notes (Signed)
Cardiology Office Note:    Date:  12/24/2020   ID:  Carmen Todd, DOB 1941/01/01, MRN [id number]  PCP:  London Pepper, MD  [facility] Cardiologist:  Rudean Haskell MD [place] Electrophysiologist:  None   CC: HFpEF Follow up  History of Present Illness:    Carmen Todd is a 80 y.o. female with a hx of HTN, HLD, Carotid Artery Stenosis with new HFpEF who presents for evaluation 09/17/20.  In interim of this visit, patient had negative stress test.  Patient notes that she is doing great.  Since day last visit notes able to garden without issues.  Relevant interval testing or therapy include stress test.  There are no interval hospital/ED visit.    No chest pain or pressure .  No SOB/DOE and no PND/Orthopnea.  Notes slight weight gain but no significant leg swelling.  No palpitations or syncope.  Ambulatory blood pressure (new arm cuff)  120/56 (log reviewed)   Pulse 56.   Past Medical History:  Diagnosis Date  . Hyperlipidemia   . Hypertension   . Thyroid disease     Past Surgical History:  Procedure Laterality Date  . APPENDECTOMY    . EYE SURGERY      Current Medications: Current Meds  Medication Sig  . amLODipine (NORVASC) 5 MG tablet daily.  Marland Kitchen aspirin EC 81 MG tablet Take 81 mg by mouth daily.  Marland Kitchen atenolol (TENORMIN) 25 MG tablet daily.  Marland Kitchen atorvastatin (LIPITOR) 40 MG tablet Take 40 mg by mouth daily.  . ciclopirox (PENLAC) 8 % solution daily as needed.  . DORZOLAMIDE HCL OP Apply 1 drop to eye 2 (two) times daily. Rt eye only  . lansoprazole (PREVACID) 15 MG capsule Take 15 mg by mouth daily at 12 noon.  Marland Kitchen levothyroxine (SYNTHROID, LEVOTHROID) 100 MCG tablet Take 100 mcg by mouth daily before breakfast.  . Multiple Vitamins-Minerals (MULTIVITAMIN WITH MINERALS) tablet Take 1 tablet by mouth daily.  Marland Kitchen olmesartan (BENICAR) 20 MG tablet Take 20 mg by mouth daily.  . timolol (TIMOPTIC) 0.5 % ophthalmic solution   . triamcinolone cream (KENALOG) 0.1 %  Apply topically as needed.  . Vitamin D, Cholecalciferol, 1000 units CAPS Take 1 capsule by mouth daily.     Allergies:   Alendronate, Lisinopril, Hctz [hydrochlorothiazide], Inderal [propranolol], Morphine and related, and Penicillins   Social History   Socioeconomic History  . Marital status: Widowed    Spouse name: Not on file  . Number of children: Not on file  . Years of education: Not on file  . Highest education level: Not on file  Occupational History  . Not on file  Tobacco Use  . Smoking status: Former Smoker    Packs/day: 0.50    Years: 16.00    Pack years: 8.00    Types: Cigarettes    Quit date: 1972    Years since quitting: 50.3  . Smokeless tobacco: Never Used  Substance and Sexual Activity  . Alcohol use: Not on file  . Drug use: Not on file  . Sexual activity: Not on file  Other Topics Concern  . Not on file  Social History Narrative  . Not on file   Social Determinants of Health   Financial Resource Strain: Not on file  Food Insecurity: Not on file  Transportation Needs: Not on file  Physical Activity: Not on file  Stress: Not on file  Social Connections: Not on file    Social: From [place]; Had Easter with the family  and several other families  Family History: History of coronary artery disease notable for grandparents. History of heart failure notable for grandfather. History of arrhythmia notable for no members.  ROS:   Please see the history of present illness.     All other systems reviewed and are negative.  EKGs/Labs/Other Studies Reviewed:    The following studies were reviewed today:  EKG:   09/17/20: Sinus Bradycardia rate 57 borderline anterior infarct pattern  Transthoracic Echocardiogram: Date: 08/16/2020 Results: diminished tissue Doppler, estimated LA pressure 18 mm Hg IMPRESSIONS  1. Left ventricular ejection fraction, by estimation, is 60 to 65%. The  left ventricle has normal function. The left ventricle has no  regional  wall motion abnormalities. Left ventricular diastolic parameters are  consistent with Grade II diastolic  dysfunction (pseudonormalization).  2. Right ventricular systolic function is normal. The right ventricular  size is normal. There is normal pulmonary artery systolic pressure.  3. Left atrial size was moderately dilated.  4. Right atrial size was mildly dilated.  5. The mitral valve is normal in structure. Mild mitral valve  regurgitation.  6. The aortic valve is tricuspid. There is mild calcification of the  aortic valve. Aortic valve regurgitation is not visualized. Mild aortic  valve sclerosis is present, with no evidence of aortic valve stenosis.  7. The inferior vena cava is normal in size with greater than 50%  respiratory variability, suggesting right atrial pressure of 3 mmHg.   NM Stress Testing : Date:12/17/20 Results:  Nuclear stress EF: 66%.  There was no ST segment deviation noted during stress.  No T wave inversion was noted during stress.  The study is normal with no evidence of ischemia or infarct.  This is a low risk study.  The left ventricular ejection fraction is hyperdynamic (>65%).  Recent Labs: No results found for requested labs within last 8760 hours.  Recent Lipid Panel No results found for: CHOL, TRIG, HDL, CHOLHDL, VLDL, LDLCALC, LDLDIRECT  [facility] Labs 11/22/2019:   Cholesterol  Total 144 HDL 51 LDL 71 TG 118 K 3.9 Creatinine 0.67  Risk Assessment/Calculations:     N/A  Physical Exam:    VS:  BP (!) 122/58   Pulse (!) 56   Ht 5' 0.5" (1.537 m)   Wt 124 lb (56.2 kg)   SpO2 97%   BMI 23.82 kg/m     Wt Readings from Last 3 Encounters:  12/24/20 124 lb (56.2 kg)  10/03/20 124 lb (56.2 kg)  09/17/20 124 lb 6.4 oz (56.4 kg)    GEN:  Well nourished, well developed in no acute distress HEENT: Normal NECK: No JVD; No carotid bruits LYMPHATICS: No lymphadenopathy CARDIAC: RRR, no murmurs, rubs,  gallops RESPIRATORY:  Clear to auscultation without rales, wheezing or rhonchi  ABDOMEN: Soft, non-tender, non-distended, no dullness to percussion or fluid wave MUSCULOSKELETAL:  No edema; No deformity  SKIN: Warm and dry NEUROLOGIC:  Alert and oriented x 3 PSYCHIATRIC:  Normal affect   ASSESSMENT:    1. Chronic heart failure with preserved ejection fraction ([place])   2. Essential hypertension   3. Mixed hyperlipidemia    PLAN:    In order of problems listed above:  Heart Failure Preserved Ejection Fraction  DOE (resolved) HTN, HLD - NYHA class I, Stage B, euvolemic, etiology from HTN - Diuretic regimen: euvolemic - Strict I/Os, daily weights, and fluid restriction of < 2 L  - Replace electrolytes PRN and keep K>4 and Mg>2. - continue olmesartan, norvasc, and atenolol -  low threshold to start Aldactone or  SGLT2i [place] will be next addition) - lipids at goal - ambulatory blood pressure at goal, will continue ambulatory BP monitoring; gave education on how to perform ambulatory blood pressure monitoring including the frequency and technique; goal ambulatory blood pressure < 130/80 on average  One year follow up unless new symptoms or abnormal test results warranting change in plan  Would be reasonable for Video Visit Follow up Would be reasonable for  APP Follow up     Medication Adjustments/Labs and Tests Ordered: Current medicines are reviewed at length with the patient today.  Concerns regarding medicines are outlined above.  No orders of the defined types were placed in this encounter.  No orders of the defined types were placed in this encounter.   Patient Instructions  Medication Instructions:  Your physician recommends that you continue on your current medications as directed. Please refer to the Current Medication list given to you today.  *If you need a refill on your cardiac medications before your next appointment, please call your pharmacy*   Lab  Work: NONE If you have labs (blood work) drawn today and your tests are completely normal, you will receive your results only by: Marland Kitchen MyChart Message (if you have MyChart) OR . A paper copy in the mail If you have any lab test that is abnormal or we need to change your treatment, we will call you to review the results.   Testing/Procedures: NONE   Follow-Up: At [facility], you and your health needs are our priority.  As part of our continuing mission to provide you with exceptional heart care, we have created designated Provider Care Teams.  These Care Teams include your primary Cardiologist (physician) and Advanced Practice Providers (APPs -  Physician Assistants and Nurse Practitioners) who all work together to provide you with the care you need, when you need it.  We recommend signing up for the patient portal called "MyChart".  Sign up information is provided on this After Visit Summary.  MyChart is used to connect with patients for Virtual Visits (Telemedicine).  Patients are able to view lab/test results, encounter notes, upcoming appointments, etc.  Non-urgent messages can be sent to your provider as well.   To learn more about what you can do with MyChart, go to [email or web address].    Your next appointment:   12 month(s)  The format for your next appointment:   In Person  Provider:   You may see Rudean Haskell, MD or one of the following Advanced Practice Providers on your designated Care Team:    Melina Copa, PA-C  Ermalinda Barrios, PA-C          Signed, Werner Lean, MD  12/24/2020 9:48 AM    [place]

## 2020-12-24 ENCOUNTER — Ambulatory Visit (INDEPENDENT_AMBULATORY_CARE_PROVIDER_SITE_OTHER): Payer: Medicare Other | Admitting: Internal Medicine

## 2020-12-24 ENCOUNTER — Other Ambulatory Visit: Payer: Self-pay

## 2020-12-24 ENCOUNTER — Encounter: Payer: Self-pay | Admitting: Internal Medicine

## 2020-12-24 VITALS — BP 122/58 | HR 56 | Ht 60.5 in | Wt 124.0 lb

## 2020-12-24 DIAGNOSIS — E782 Mixed hyperlipidemia: Secondary | ICD-10-CM

## 2020-12-24 DIAGNOSIS — I5032 Chronic diastolic (congestive) heart failure: Secondary | ICD-10-CM

## 2020-12-24 DIAGNOSIS — I10 Essential (primary) hypertension: Secondary | ICD-10-CM | POA: Diagnosis not present

## 2020-12-24 NOTE — Patient Instructions (Signed)
Medication Instructions:  Your physician recommends that you continue on your current medications as directed. Please refer to the Current Medication list given to you today.  *If you need a refill on your cardiac medications before your next appointment, please call your pharmacy*   Lab Work: NONE If you have labs (blood work) drawn today and your tests are completely normal, you will receive your results only by: Marland Kitchen MyChart Message (if you have MyChart) OR . A paper copy in the mail If you have any lab test that is abnormal or we need to change your treatment, we will call you to review the results.   Testing/Procedures: NONE   Follow-Up: At [facility], you and your health needs are our priority.  As part of our continuing mission to provide you with exceptional heart care, we have created designated Provider Care Teams.  These Care Teams include your primary Cardiologist (physician) and Advanced Practice Providers (APPs -  Physician Assistants and Nurse Practitioners) who all work together to provide you with the care you need, when you need it.  We recommend signing up for the patient portal called "MyChart".  Sign up information is provided on this After Visit Summary.  MyChart is used to connect with patients for Virtual Visits (Telemedicine).  Patients are able to view lab/test results, encounter notes, upcoming appointments, etc.  Non-urgent messages can be sent to your provider as well.   To learn more about what you can do with MyChart, go to [email or web address].    Your next appointment:   12 month(s)  The format for your next appointment:   In Person  Provider:   You may see Rudean Haskell, MD or one of the following Advanced Practice Providers on your designated Care Team:    Melina Copa, PA-C  Ermalinda Barrios, PA-C

## 2020-12-25 DIAGNOSIS — Z0000 Encounter for general adult medical examination without abnormal findings: Secondary | ICD-10-CM | POA: Diagnosis not present

## 2020-12-25 DIAGNOSIS — E039 Hypothyroidism, unspecified: Secondary | ICD-10-CM | POA: Diagnosis not present

## 2020-12-25 DIAGNOSIS — M8588 Other specified disorders of bone density and structure, other site: Secondary | ICD-10-CM | POA: Diagnosis not present

## 2020-12-25 DIAGNOSIS — I10 Essential (primary) hypertension: Secondary | ICD-10-CM | POA: Diagnosis not present

## 2020-12-25 DIAGNOSIS — I5032 Chronic diastolic (congestive) heart failure: Secondary | ICD-10-CM | POA: Diagnosis not present

## 2020-12-25 DIAGNOSIS — R7303 Prediabetes: Secondary | ICD-10-CM | POA: Diagnosis not present

## 2020-12-25 DIAGNOSIS — E785 Hyperlipidemia, unspecified: Secondary | ICD-10-CM | POA: Diagnosis not present

## 2021-02-12 DIAGNOSIS — U071 COVID-19: Secondary | ICD-10-CM | POA: Diagnosis not present

## 2021-02-14 ENCOUNTER — Other Ambulatory Visit: Payer: Self-pay | Admitting: Family Medicine

## 2021-02-14 DIAGNOSIS — Z1231 Encounter for screening mammogram for malignant neoplasm of breast: Secondary | ICD-10-CM

## 2021-02-26 DIAGNOSIS — M170 Bilateral primary osteoarthritis of knee: Secondary | ICD-10-CM | POA: Diagnosis not present

## 2021-03-05 DIAGNOSIS — M13862 Other specified arthritis, left knee: Secondary | ICD-10-CM | POA: Diagnosis not present

## 2021-03-05 DIAGNOSIS — M13861 Other specified arthritis, right knee: Secondary | ICD-10-CM | POA: Diagnosis not present

## 2021-03-13 DIAGNOSIS — M13862 Other specified arthritis, left knee: Secondary | ICD-10-CM | POA: Diagnosis not present

## 2021-03-13 DIAGNOSIS — M13861 Other specified arthritis, right knee: Secondary | ICD-10-CM | POA: Diagnosis not present

## 2021-04-16 ENCOUNTER — Ambulatory Visit
Admission: RE | Admit: 2021-04-16 | Discharge: 2021-04-16 | Disposition: A | Discharge status: Home / Self Care | Payer: Medicare Other | Source: Ambulatory Visit | Attending: Family Medicine | Admitting: Family Medicine

## 2021-04-16 ENCOUNTER — Other Ambulatory Visit: Payer: Self-pay

## 2021-04-16 DIAGNOSIS — Z1231 Encounter for screening mammogram for malignant neoplasm of breast: Secondary | ICD-10-CM | POA: Diagnosis not present

## 2021-06-05 DIAGNOSIS — Z23 Encounter for immunization: Secondary | ICD-10-CM | POA: Diagnosis not present

## 2021-06-19 DIAGNOSIS — R7303 Prediabetes: Secondary | ICD-10-CM | POA: Diagnosis not present

## 2021-06-19 DIAGNOSIS — I6529 Occlusion and stenosis of unspecified carotid artery: Secondary | ICD-10-CM | POA: Diagnosis not present

## 2021-06-19 DIAGNOSIS — E785 Hyperlipidemia, unspecified: Secondary | ICD-10-CM | POA: Diagnosis not present

## 2021-06-19 DIAGNOSIS — I10 Essential (primary) hypertension: Secondary | ICD-10-CM | POA: Diagnosis not present

## 2021-06-19 DIAGNOSIS — E039 Hypothyroidism, unspecified: Secondary | ICD-10-CM | POA: Diagnosis not present

## 2021-06-20 IMAGING — MG DIGITAL SCREENING BILATERAL MAMMOGRAM WITH TOMO AND CAD
8 series · 8 of 24 positions shown · non-contrast
Comparison: Previous exam(s).

CLINICAL DATA: Screening.

EXAM:
DIGITAL SCREENING BILATERAL MAMMOGRAM WITH TOMO AND CAD

[L MLO synth-2D]
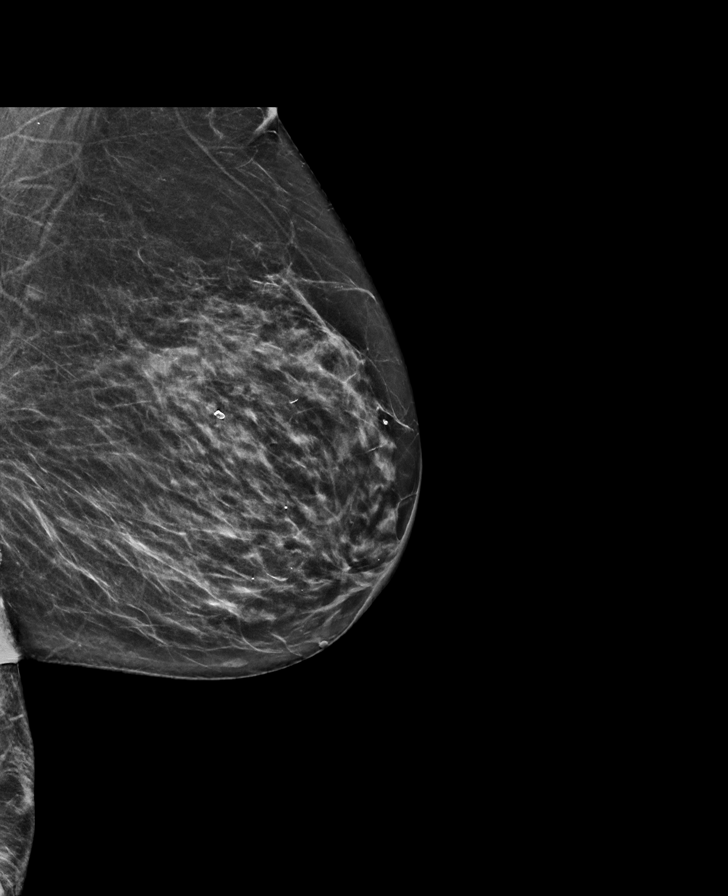

[L CC synth-2D]
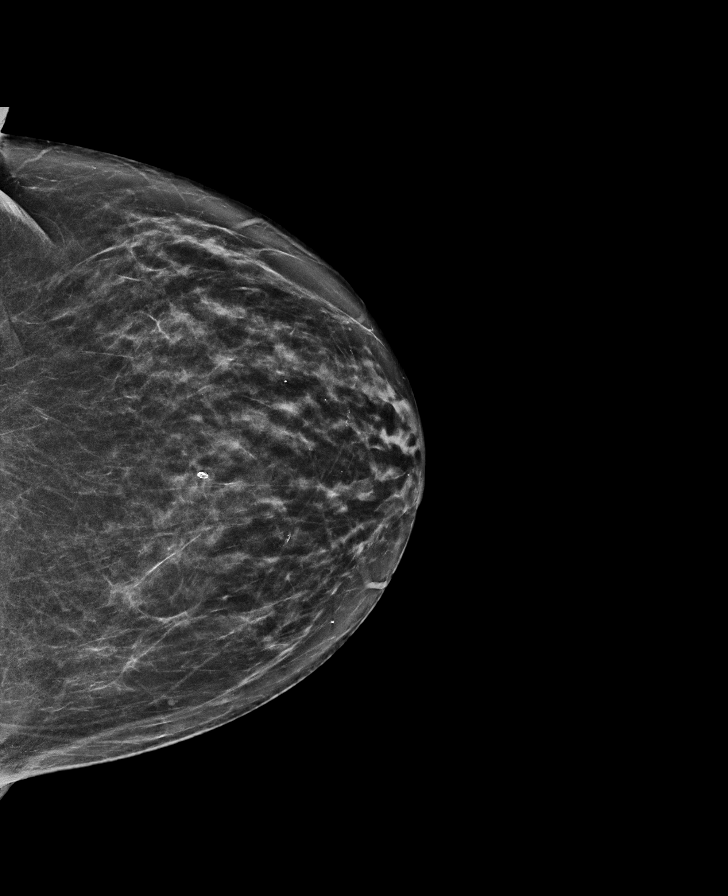

[R MLO synth-2D]
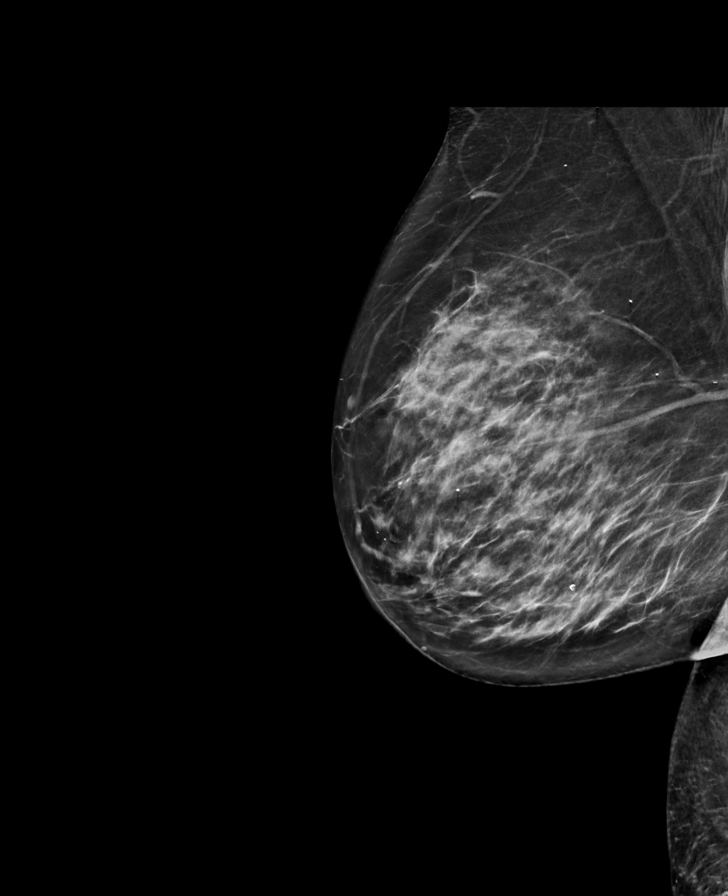

[R CC synth-2D]
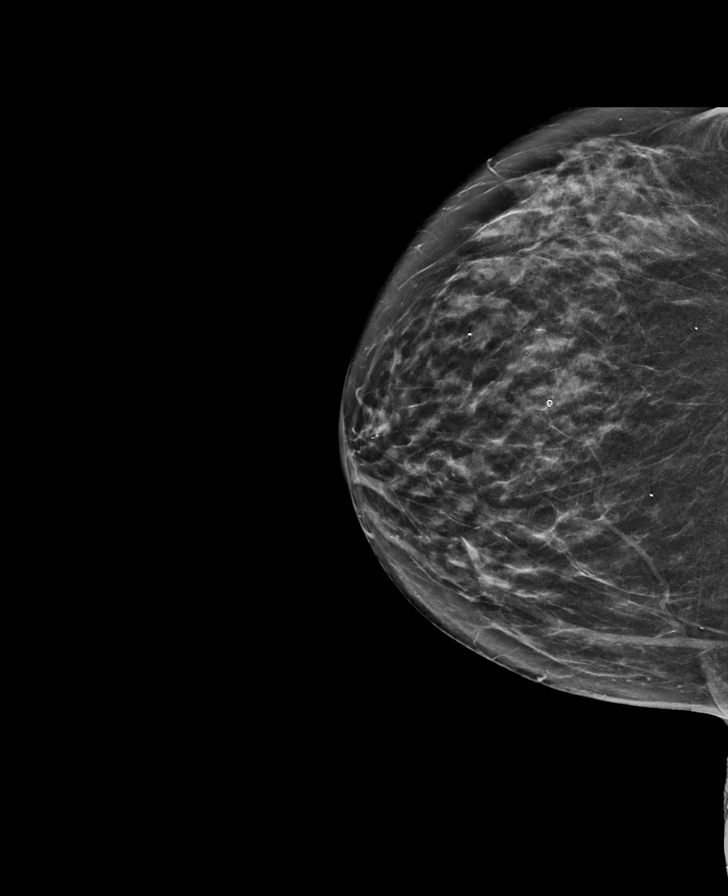

[L MLO tomo · tomo slice 31/62.0]
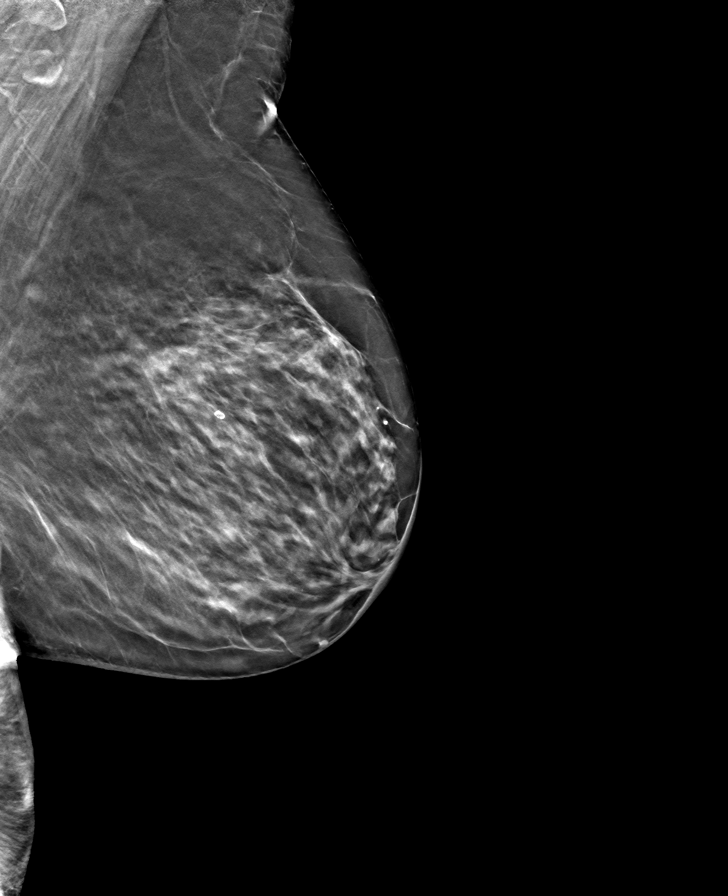

[R CC tomo · tomo slice 31/61.0]
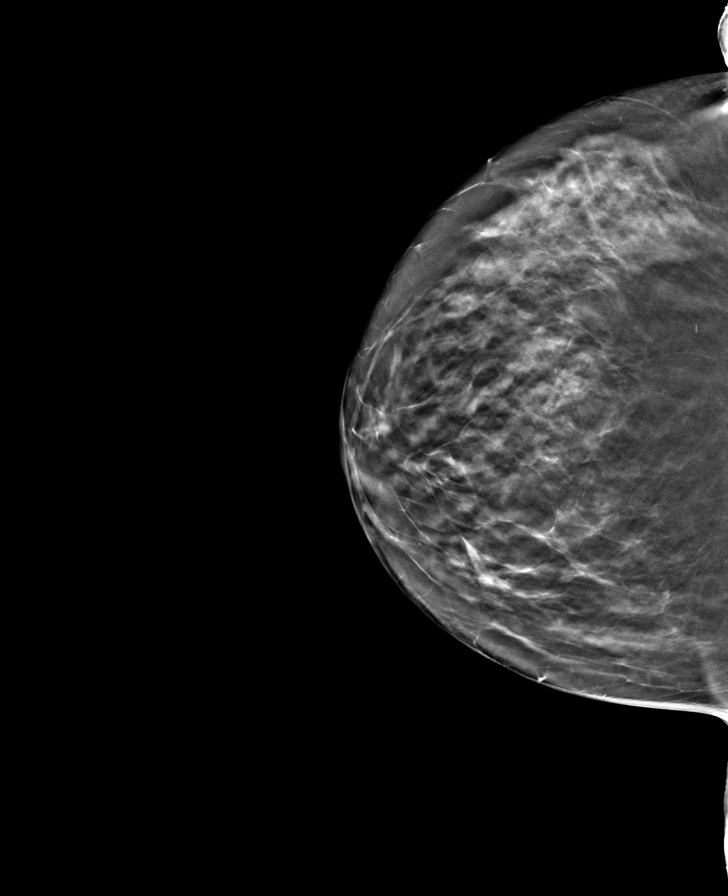

[L CC tomo · tomo slice 30/59.0]
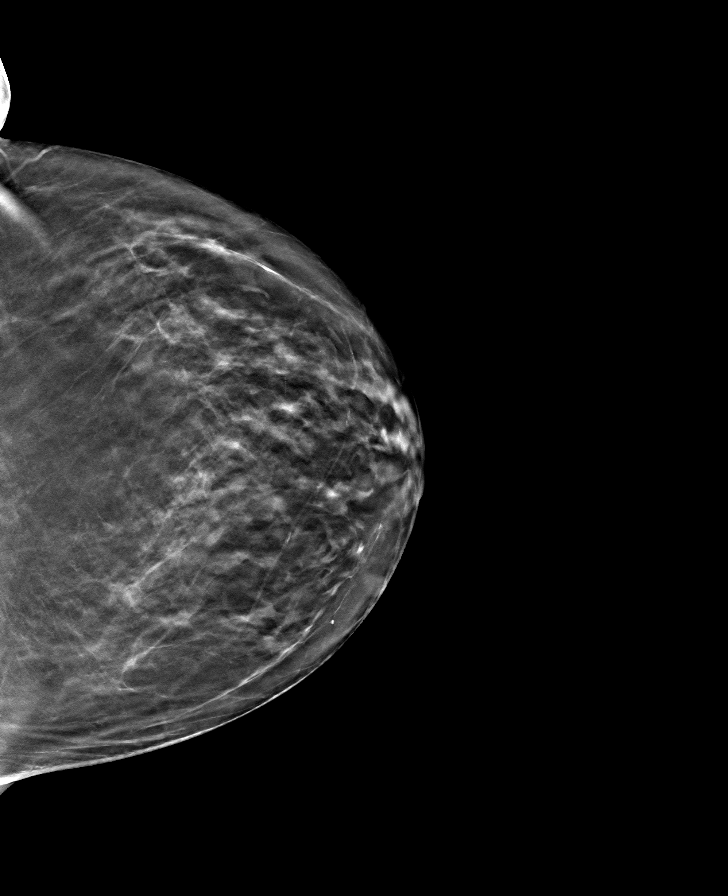

[R MLO tomo · tomo slice 33/65.0]
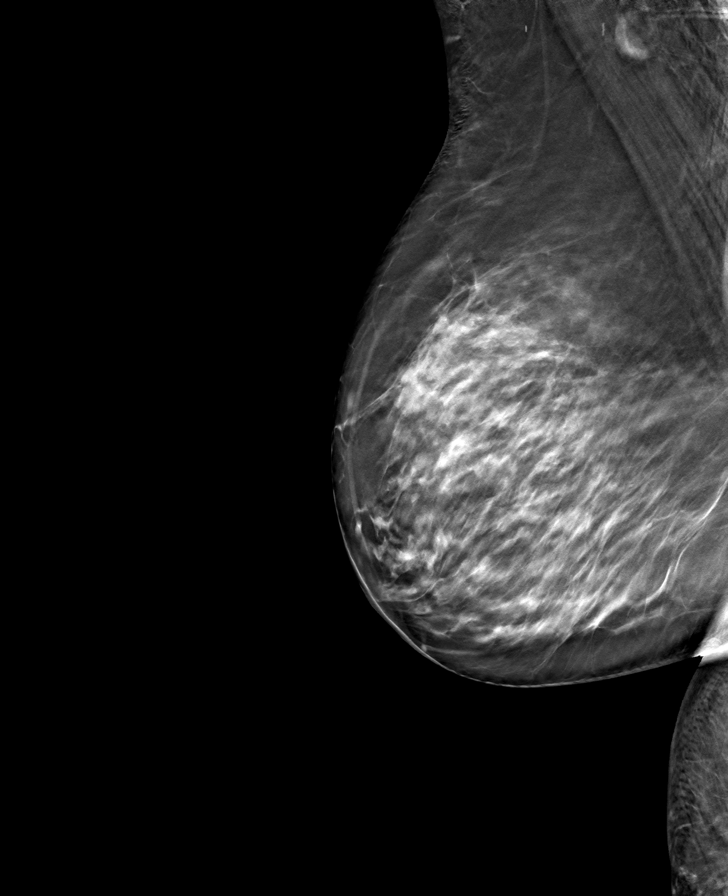

[8 of 24 positions shown; findings below may reference images not displayed]

ACR Breast Density Category c: The breast tissue is heterogeneously
dense, which may obscure small masses.
FINDINGS: There are no findings suspicious for malignancy. Images were
processed with CAD.
IMPRESSION: No mammographic evidence of malignancy. A result letter of this
screening mammogram will be mailed directly to the patient.

RECOMMENDATION:
Screening mammogram in one year. (Code:[id number])

BI-RADS CATEGORY  1: Negative.

## 2021-06-28 DIAGNOSIS — B309 Viral conjunctivitis, unspecified: Secondary | ICD-10-CM | POA: Diagnosis not present

## 2021-10-01 DIAGNOSIS — M170 Bilateral primary osteoarthritis of knee: Secondary | ICD-10-CM | POA: Diagnosis not present

## 2021-10-09 DIAGNOSIS — M170 Bilateral primary osteoarthritis of knee: Secondary | ICD-10-CM | POA: Diagnosis not present

## 2021-10-16 DIAGNOSIS — M170 Bilateral primary osteoarthritis of knee: Secondary | ICD-10-CM | POA: Diagnosis not present

## 2021-11-18 DIAGNOSIS — L821 Other seborrheic keratosis: Secondary | ICD-10-CM | POA: Diagnosis not present

## 2021-11-18 DIAGNOSIS — Z85828 Personal history of other malignant neoplasm of skin: Secondary | ICD-10-CM | POA: Diagnosis not present

## 2021-11-18 DIAGNOSIS — L920 Granuloma annulare: Secondary | ICD-10-CM | POA: Diagnosis not present

## 2022-01-21 DIAGNOSIS — H35371 Puckering of macula, right eye: Secondary | ICD-10-CM | POA: Diagnosis not present

## 2022-01-21 DIAGNOSIS — H353111 Nonexudative age-related macular degeneration, right eye, early dry stage: Secondary | ICD-10-CM | POA: Diagnosis not present

## 2022-01-21 DIAGNOSIS — H35341 Macular cyst, hole, or pseudohole, right eye: Secondary | ICD-10-CM | POA: Diagnosis not present

## 2022-01-21 DIAGNOSIS — H353222 Exudative age-related macular degeneration, left eye, with inactive choroidal neovascularization: Secondary | ICD-10-CM | POA: Diagnosis not present

## 2022-01-24 DIAGNOSIS — E039 Hypothyroidism, unspecified: Secondary | ICD-10-CM | POA: Diagnosis not present

## 2022-01-24 DIAGNOSIS — Z0000 Encounter for general adult medical examination without abnormal findings: Secondary | ICD-10-CM | POA: Diagnosis not present

## 2022-01-24 DIAGNOSIS — I5032 Chronic diastolic (congestive) heart failure: Secondary | ICD-10-CM | POA: Diagnosis not present

## 2022-01-24 DIAGNOSIS — I10 Essential (primary) hypertension: Secondary | ICD-10-CM | POA: Diagnosis not present

## 2022-01-24 DIAGNOSIS — R7303 Prediabetes: Secondary | ICD-10-CM | POA: Diagnosis not present

## 2022-01-24 DIAGNOSIS — M8588 Other specified disorders of bone density and structure, other site: Secondary | ICD-10-CM | POA: Diagnosis not present

## 2022-01-24 DIAGNOSIS — E785 Hyperlipidemia, unspecified: Secondary | ICD-10-CM | POA: Diagnosis not present

## 2022-01-29 DIAGNOSIS — E039 Hypothyroidism, unspecified: Secondary | ICD-10-CM | POA: Diagnosis not present

## 2022-01-29 DIAGNOSIS — M8588 Other specified disorders of bone density and structure, other site: Secondary | ICD-10-CM | POA: Diagnosis not present

## 2022-01-29 DIAGNOSIS — Z0000 Encounter for general adult medical examination without abnormal findings: Secondary | ICD-10-CM | POA: Diagnosis not present

## 2022-01-29 DIAGNOSIS — E785 Hyperlipidemia, unspecified: Secondary | ICD-10-CM | POA: Diagnosis not present

## 2022-01-29 DIAGNOSIS — I5032 Chronic diastolic (congestive) heart failure: Secondary | ICD-10-CM | POA: Diagnosis not present

## 2022-01-29 DIAGNOSIS — R7303 Prediabetes: Secondary | ICD-10-CM | POA: Diagnosis not present

## 2022-03-05 ENCOUNTER — Other Ambulatory Visit: Payer: Self-pay | Admitting: Family Medicine

## 2022-03-05 DIAGNOSIS — Z1231 Encounter for screening mammogram for malignant neoplasm of breast: Secondary | ICD-10-CM

## 2022-03-24 DIAGNOSIS — E039 Hypothyroidism, unspecified: Secondary | ICD-10-CM | POA: Diagnosis not present

## 2022-04-07 NOTE — Progress Notes (Unsigned)
Cardiology Office Note:    Date:  04/09/2022   ID:  Carmen Todd, DOB 1940/10/02, MRN [id number]  PCP:  London Pepper, MD  [facility] Cardiologist:  Rudean Haskell MD [place] Electrophysiologist:  None   CC: HFpEF Follow up  History of Present Illness:    Carmen Todd is a 81 y.o. 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  In interim of this visit, patient had negative stress test. 2022: Doing well with no SOB at last eval.  Patient notes that she is doing well.   Since last visit notes is doing her gardening and and has no limitations . Was up in [place] and saw family with no issues. There are no interval hospital/ED visit.    No chest pain or pressure .  No SOB/DOE unless significant exertion and no PND/Orthopnea.  No weight gain and only rare leg swelling.  Wears compression stockings except in the summer.  No palpitations or syncope .  Has some knee pain.  Past Medical History:  Diagnosis Date   Hyperlipidemia    Hypertension    Thyroid disease     Past Surgical History:  Procedure Laterality Date   APPENDECTOMY     EYE SURGERY      Current Medications: Current Meds  Medication Sig   amLODipine (NORVASC) 5 MG tablet daily.   aspirin EC 81 MG tablet Take 81 mg by mouth daily.   atenolol (TENORMIN) 25 MG tablet daily.   atorvastatin (LIPITOR) 40 MG tablet Take 40 mg by mouth daily.   lansoprazole (PREVACID) 15 MG capsule Take 15 mg by mouth daily at 12 noon.   levothyroxine (SYNTHROID) 88 MCG tablet Take 88 mcg by mouth every morning.   Misc Natural Products (T-RELIEF CBD+13) SUBL Place under the tongue in the morning and at bedtime. 1/2 dropper twice/day   Multiple Vitamins-Minerals (MULTIVITAMIN WITH MINERALS) tablet Take 1 tablet by mouth every other day.   Multiple Vitamins-Minerals (PRESERVISION AREDS 2+MULTI VIT) CAPS Take by mouth in the morning and at bedtime.    olmesartan (BENICAR) 20 MG tablet Take 20 mg by mouth daily.   timolol (TIMOPTIC) 0.5 % ophthalmic solution    triamcinolone cream (KENALOG) 0.1 % Apply topically as needed.   Vitamin D, Cholecalciferol, 1000 units CAPS Take 1 capsule by mouth daily.     Allergies:   Alendronate, Lisinopril, Hctz [hydrochlorothiazide], Inderal [propranolol], Morphine and related, and Penicillins   Social History   Socioeconomic History   Marital status: Widowed    Spouse name: Not on file   Number of children: Not on file   Years of education: Not on file   Highest education level: Not on file  Occupational History   Not on file  Tobacco Use   Smoking status: Former    Packs/day: 0.50    Years: 16.00    Total pack years: 8.00    Types: Cigarettes    Quit date: 71    Years since quitting: 51.6   Smokeless tobacco: Never  Substance and Sexual Activity   Alcohol use: Not on file   Drug use: Not on file   Sexual activity: Not on file  Other Topics Concern   Not on file  Social History Narrative   Not on file   Social Determinants of Health   Financial Resource Strain: Not on file  Food Insecurity: Not on file  Transportation Needs: Not on file  Physical Activity: Not on file  Stress: Not on file  Social Connections: Not on file    Social: From [place]; Had Easter with the family and several other families  Family History: History of coronary artery disease notable for grandparents. History of heart failure notable for grandfather. History of arrhythmia notable for no members.  ROS:   Please see the history of present illness.     All other systems reviewed and are negative.  EKGs/Labs/Other Studies Reviewed:    The following studies were reviewed today:  EKG:   04/09/22: Sinus bradycardia rate 56 09/17/20: Sinus Bradycardia rate 57 borderline anterior infarct pattern  Transthoracic Echocardiogram: Date: 08/16/2020 Results: diminished tissue Doppler, estimated LA pressure  18 mm Hg IMPRESSIONS   1. Left ventricular ejection fraction, by estimation, is 60 to 65%. The  left ventricle has normal function. The left ventricle has no regional  wall motion abnormalities. Left ventricular diastolic parameters are  consistent with Grade II diastolic  dysfunction (pseudonormalization).   2. Right ventricular systolic function is normal. The right ventricular  size is normal. There is normal pulmonary artery systolic pressure.   3. Left atrial size was moderately dilated.   4. Right atrial size was mildly dilated.   5. The mitral valve is normal in structure. Mild mitral valve  regurgitation.   6. The aortic valve is tricuspid. There is mild calcification of the  aortic valve. Aortic valve regurgitation is not visualized. Mild aortic  valve sclerosis is present, with no evidence of aortic valve stenosis.   7. The inferior vena cava is normal in size with greater than 50%  respiratory variability, suggesting right atrial pressure of 3 mmHg.   NM Stress Testing : Date:12/17/20 Results: Nuclear stress EF: 66%. There was no ST segment deviation noted during stress. No T wave inversion was noted during stress. The study is normal with no evidence of ischemia or infarct. This is a low risk study. The left ventricular ejection fraction is hyperdynamic (>65%).  Recent Labs: No results found for requested labs within last 365 days.  Recent Lipid Panel No results found for: "CHOL", "TRIG", "HDL", "CHOLHDL", "VLDL", "LDLCALC", "LDLDIRECT"  Physical Exam:    VS:  BP 125/73   Pulse (!) 56   Ht 5' (1.524 m)   Wt 121 lb 6.4 oz (55.1 kg)   SpO2 97%   BMI 23.71 kg/m     Wt Readings from Last 3 Encounters:  04/09/22 121 lb 6.4 oz (55.1 kg)  12/24/20 124 lb (56.2 kg)  10/03/20 124 lb (56.2 kg)    Gen: No distress  Neck: No JVD, no carotid bruit Cardiac: No Rubs or Gallops, no murmur, regular bradycardia, +2 radial pulses Respiratory: Clear to auscultation  bilaterally, normal effort, normal  respiratory rate GI: Soft, nontender, non-distended  MS: No  edema;  moves all extremities Integument: Skin feels warm Neuro:  At time of evaluation, alert and oriented to person/place/time/situation  Psych: Normal affect, patient feels well    ASSESSMENT:    1. Bilateral carotid artery stenosis   2. Essential hypertension   3. Mixed hyperlipidemia   4. Chronic heart failure with preserved ejection fraction (HCC)     PLAN:    Mild CAS Heart Failure Preserved Ejection Fraction  HTN, HLD - NYHA class I, Stage B, euvolemic, etiology from HTN - Diuretic regimen: euvolemic - continue olmesartan, norvasc, and atenolol - low threshold to start Aldactone or  SGLT2i (has not needed presently - lipids have increased; she  will work on sugars and processed foods in diet; we will repeat her Carotid Duplex; if progression of disease will increase to atorvastatin 80 mg PO daily  One year with me   Medication Adjustments/Labs and Tests Ordered: Current medicines are reviewed at length with the patient today.  Concerns regarding medicines are outlined above.  Orders Placed This Encounter  Procedures   EKG 12-Lead   VAS US CAROTID   No orders of the defined types were placed in this encounter.   Patient Instructions  Medication Instructions:  Your physician recommends that you continue on your current medications as directed. Please refer to the Current Medication list given to you today.  *If you need a refill on your cardiac medications before your next appointment, please call your pharmacy*   Lab Work: NONE If you have labs (blood work) drawn today and your tests are completely normal, you will receive your results only by: [place] (if you have MyChart) OR A paper copy in the mail If you have any lab test that is abnormal or we need to change your treatment, we will call you to review the results.   Testing/Procedures: Your  physician has requested that you have a carotid duplex. This test is an ultrasound of the carotid arteries in your neck. It looks at blood flow through these arteries that supply the brain with blood. Allow one hour for this exam. There are no restrictions or special instructions.    Follow-Up: At [facility], you and your health needs are our priority.  As part of our continuing mission to provide you with exceptional heart care, we have created designated Provider Care Teams.  These Care Teams include your primary Cardiologist (physician) and Advanced Practice Providers (APPs -  Physician Assistants and Nurse Practitioners) who all work together to provide you with the care you need, when you need it.  Your next appointment:   1 year(s)  The format for your next appointment:   In Person  Provider:   Werner Lean, MD    Important Information About Sugar         Signed, Werner Lean, MD  04/09/2022 10:28 AM    [place]

## 2022-04-09 ENCOUNTER — Encounter: Payer: Self-pay | Admitting: Internal Medicine

## 2022-04-09 ENCOUNTER — Ambulatory Visit (INDEPENDENT_AMBULATORY_CARE_PROVIDER_SITE_OTHER): Payer: Medicare Other | Admitting: Internal Medicine

## 2022-04-09 VITALS — BP 125/73 | HR 56 | Ht 60.0 in | Wt 121.4 lb

## 2022-04-09 DIAGNOSIS — I6523 Occlusion and stenosis of bilateral carotid arteries: Secondary | ICD-10-CM | POA: Insufficient documentation

## 2022-04-09 DIAGNOSIS — I5032 Chronic diastolic (congestive) heart failure: Secondary | ICD-10-CM

## 2022-04-09 DIAGNOSIS — I10 Essential (primary) hypertension: Secondary | ICD-10-CM | POA: Diagnosis not present

## 2022-04-09 DIAGNOSIS — E782 Mixed hyperlipidemia: Secondary | ICD-10-CM

## 2022-04-09 NOTE — Patient Instructions (Signed)
Medication Instructions:  Your physician recommends that you continue on your current medications as directed. Please refer to the Current Medication list given to you today.  *If you need a refill on your cardiac medications before your next appointment, please call your pharmacy*   Lab Work: NONE If you have labs (blood work) drawn today and your tests are completely normal, you will receive your results only by: [place] (if you have MyChart) OR A paper copy in the mail If you have any lab test that is abnormal or we need to change your treatment, we will call you to review the results.   Testing/Procedures: Your physician has requested that you have a carotid duplex. This test is an ultrasound of the carotid arteries in your neck. It looks at blood flow through these arteries that supply the brain with blood. Allow one hour for this exam. There are no restrictions or special instructions.    Follow-Up: At [facility], you and your health needs are our priority.  As part of our continuing mission to provide you with exceptional heart care, we have created designated Provider Care Teams.  These Care Teams include your primary Cardiologist (physician) and Advanced Practice Providers (APPs -  Physician Assistants and Nurse Practitioners) who all work together to provide you with the care you need, when you need it.  Your next appointment:   1 year(s)  The format for your next appointment:   In Person  Provider:   Werner Lean, MD    Important Information About Sugar

## 2022-04-17 ENCOUNTER — Ambulatory Visit
Admission: RE | Admit: 2022-04-17 | Discharge: 2022-04-17 | Disposition: A | Discharge status: Home / Self Care | Payer: Medicare Other | Source: Ambulatory Visit | Attending: Family Medicine | Admitting: Family Medicine

## 2022-04-17 DIAGNOSIS — Z1231 Encounter for screening mammogram for malignant neoplasm of breast: Secondary | ICD-10-CM

## 2022-04-17 DIAGNOSIS — G2581 Restless legs syndrome: Secondary | ICD-10-CM | POA: Diagnosis not present

## 2022-04-17 DIAGNOSIS — R7303 Prediabetes: Secondary | ICD-10-CM | POA: Diagnosis not present

## 2022-04-17 DIAGNOSIS — R252 Cramp and spasm: Secondary | ICD-10-CM | POA: Diagnosis not present

## 2022-04-17 DIAGNOSIS — R202 Paresthesia of skin: Secondary | ICD-10-CM | POA: Diagnosis not present

## 2022-04-18 ENCOUNTER — Ambulatory Visit (HOSPITAL_COMMUNITY)
Admission: RE | Admit: 2022-04-18 | Discharge: 2022-04-18 | Disposition: A | Discharge status: Home / Self Care | Payer: Medicare Other | Source: Ambulatory Visit | Attending: Cardiovascular Disease | Admitting: Cardiovascular Disease

## 2022-04-18 DIAGNOSIS — I6523 Occlusion and stenosis of bilateral carotid arteries: Secondary | ICD-10-CM

## 2022-04-28 DIAGNOSIS — H2513 Age-related nuclear cataract, bilateral: Secondary | ICD-10-CM | POA: Diagnosis not present

## 2022-05-13 DIAGNOSIS — Z23 Encounter for immunization: Secondary | ICD-10-CM | POA: Diagnosis not present

## 2022-06-18 DIAGNOSIS — M170 Bilateral primary osteoarthritis of knee: Secondary | ICD-10-CM | POA: Diagnosis not present

## 2022-06-25 DIAGNOSIS — M170 Bilateral primary osteoarthritis of knee: Secondary | ICD-10-CM | POA: Diagnosis not present

## 2022-07-01 DIAGNOSIS — M170 Bilateral primary osteoarthritis of knee: Secondary | ICD-10-CM | POA: Diagnosis not present

## 2022-08-14 DIAGNOSIS — E039 Hypothyroidism, unspecified: Secondary | ICD-10-CM | POA: Diagnosis not present

## 2022-08-14 DIAGNOSIS — E785 Hyperlipidemia, unspecified: Secondary | ICD-10-CM | POA: Diagnosis not present

## 2022-08-14 DIAGNOSIS — H409 Unspecified glaucoma: Secondary | ICD-10-CM | POA: Diagnosis not present

## 2022-08-14 DIAGNOSIS — R7303 Prediabetes: Secondary | ICD-10-CM | POA: Diagnosis not present

## 2022-08-14 DIAGNOSIS — K219 Gastro-esophageal reflux disease without esophagitis: Secondary | ICD-10-CM | POA: Diagnosis not present

## 2022-08-14 DIAGNOSIS — I6529 Occlusion and stenosis of unspecified carotid artery: Secondary | ICD-10-CM | POA: Diagnosis not present

## 2022-08-14 DIAGNOSIS — H3530 Unspecified macular degeneration: Secondary | ICD-10-CM | POA: Diagnosis not present

## 2022-08-14 DIAGNOSIS — I10 Essential (primary) hypertension: Secondary | ICD-10-CM | POA: Diagnosis not present

## 2022-10-09 DIAGNOSIS — D485 Neoplasm of uncertain behavior of skin: Secondary | ICD-10-CM | POA: Diagnosis not present

## 2022-10-09 DIAGNOSIS — L720 Epidermal cyst: Secondary | ICD-10-CM | POA: Diagnosis not present

## 2022-10-09 DIAGNOSIS — D1801 Hemangioma of skin and subcutaneous tissue: Secondary | ICD-10-CM | POA: Diagnosis not present

## 2022-10-09 DIAGNOSIS — L920 Granuloma annulare: Secondary | ICD-10-CM | POA: Diagnosis not present

## 2022-10-09 DIAGNOSIS — Z85828 Personal history of other malignant neoplasm of skin: Secondary | ICD-10-CM | POA: Diagnosis not present

## 2022-10-09 DIAGNOSIS — L821 Other seborrheic keratosis: Secondary | ICD-10-CM | POA: Diagnosis not present

## 2022-11-03 DIAGNOSIS — H401132 Primary open-angle glaucoma, bilateral, moderate stage: Secondary | ICD-10-CM | POA: Diagnosis not present

## 2023-01-29 DIAGNOSIS — E785 Hyperlipidemia, unspecified: Secondary | ICD-10-CM | POA: Diagnosis not present

## 2023-01-29 DIAGNOSIS — I5032 Chronic diastolic (congestive) heart failure: Secondary | ICD-10-CM | POA: Diagnosis not present

## 2023-01-29 DIAGNOSIS — M8588 Other specified disorders of bone density and structure, other site: Secondary | ICD-10-CM | POA: Diagnosis not present

## 2023-01-29 DIAGNOSIS — I10 Essential (primary) hypertension: Secondary | ICD-10-CM | POA: Diagnosis not present

## 2023-01-29 DIAGNOSIS — E039 Hypothyroidism, unspecified: Secondary | ICD-10-CM | POA: Diagnosis not present

## 2023-01-29 DIAGNOSIS — Z0000 Encounter for general adult medical examination without abnormal findings: Secondary | ICD-10-CM | POA: Diagnosis not present

## 2023-01-29 DIAGNOSIS — R7303 Prediabetes: Secondary | ICD-10-CM | POA: Diagnosis not present

## 2023-03-04 ENCOUNTER — Other Ambulatory Visit: Payer: Self-pay | Admitting: Family Medicine

## 2023-03-04 DIAGNOSIS — Z1231 Encounter for screening mammogram for malignant neoplasm of breast: Secondary | ICD-10-CM

## 2023-03-23 DIAGNOSIS — E039 Hypothyroidism, unspecified: Secondary | ICD-10-CM | POA: Diagnosis not present

## 2023-03-24 DIAGNOSIS — H2513 Age-related nuclear cataract, bilateral: Secondary | ICD-10-CM | POA: Diagnosis not present

## 2023-03-24 DIAGNOSIS — H401132 Primary open-angle glaucoma, bilateral, moderate stage: Secondary | ICD-10-CM | POA: Diagnosis not present

## 2023-04-20 ENCOUNTER — Ambulatory Visit
Admission: RE | Admit: 2023-04-20 | Discharge: 2023-04-20 | Disposition: A | Discharge status: Home / Self Care | Payer: Medicare Other | Source: Ambulatory Visit | Attending: Family Medicine | Admitting: Family Medicine

## 2023-04-20 DIAGNOSIS — Z1231 Encounter for screening mammogram for malignant neoplasm of breast: Secondary | ICD-10-CM | POA: Diagnosis not present

## 2023-04-21 NOTE — Progress Notes (Unsigned)
Cardiology Office Note:    Date:  04/22/2023   ID:  Carmen Todd, DOB 1940/10/05, MRN [id number]  PCP:  Farris Has, MD  [facility] Cardiologist:  Riley Lam MD [facility] Electrophysiologist:  None   CC: HFpEF/HLD f/u  History of Present Illness:    Carmen Todd is a 82 y.o. 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  In interim of this visit, patient had negative stress test. 2022: Doing well with no SOB at last eval. 2023: went back to [place]; worked on decreasing sugar intake  Patient notes that she is doing well.   Since last visit notes that she went to the [employer]  Sister died of dementia.. There are no interval hospital/ED visit.   EKG showed sinus bradycardia.  No chest pain or pressure . Feels more fatigue and DOE.  Always feels cold.  Here PCP is improved her thyroid management. No SOB and no PND/Orthopnea.  Notes weight gain and leg swelling.  No palpitations or syncope .   Past Medical History:  Diagnosis Date   Hyperlipidemia    Hypertension    Thyroid disease     Past Surgical History:  Procedure Laterality Date   APPENDECTOMY     EYE SURGERY      Current Medications: Current Meds  Medication Sig   amLODipine (NORVASC) 5 MG tablet daily.   aspirin EC 81 MG tablet Take 81 mg by mouth daily.   atenolol (TENORMIN) 25 MG tablet daily.   atorvastatin (LIPITOR) 40 MG tablet Take 40 mg by mouth daily.   lansoprazole (PREVACID) 15 MG capsule Take 15 mg by mouth daily at 12 noon.   levothyroxine (SYNTHROID) 75 MCG tablet Take 75 mcg by mouth daily before breakfast.   Misc Natural Products (T-RELIEF CBD+13) SUBL Place 500 mg under the tongue in the morning and at bedtime. 3/4 dropper twice/day   Multiple Vitamins-Minerals (MULTIVITAMIN WITH MINERALS) tablet Take 1 tablet by mouth every other day.   Multiple Vitamins-Minerals (PRESERVISION AREDS 2+MULTI VIT) CAPS  Take by mouth in the morning and at bedtime.   olmesartan (BENICAR) 20 MG tablet Take 20 mg by mouth daily.   timolol (TIMOPTIC) 0.5 % ophthalmic solution    Vitamin D, Cholecalciferol, 1000 units CAPS Take 1 capsule by mouth daily.   [DISCONTINUED] levothyroxine (SYNTHROID) 88 MCG tablet Take 88 mcg by mouth every morning.   [DISCONTINUED] triamcinolone cream (KENALOG) 0.1 % Apply topically as needed.     Allergies:   Alendronate, Lisinopril, Hctz [hydrochlorothiazide], Inderal [propranolol], Morphine and codeine, and Penicillins   Social History   Socioeconomic History   Marital status: Widowed    Spouse name: Not on file   Number of children: Not on file   Years of education: Not on file   Highest education level: Not on file  Occupational History   Not on file  Tobacco Use   Smoking status: Former    Current packs/day: 0.00    Average packs/day: 0.5 packs/day for 16.0 years (8.0 ttl pk-yrs)    Types: Cigarettes    Start date: 63    Quit date: 83    Years since quitting: 52.6   Smokeless tobacco: Never  Substance and Sexual Activity   Alcohol use: Not on file   Drug use: Not on file   Sexual activity: Not on file  Other Topics Concern   Not on file  Social History Narrative  Not on file   Social Determinants of Health   Financial Resource Strain: Not on file  Food Insecurity: Not on file  Transportation Needs: Not on file  Physical Activity: Not on file  Stress: Not on file  Social Connections: Not on file    Social: From [place]; Had Easter with the family and several other families  Family History: History of coronary artery disease notable for grandparents. History of heart failure notable for grandfather. History of arrhythmia notable for no members.  ROS:   Please see the history of present illness.      EKGs/Labs/Other Studies Reviewed:    The following studies were reviewed today:  Cardiac Studies & Procedures     STRESS  TESTS  MYOCARDIAL PERFUSION IMAGING 10/03/2020  Narrative  Nuclear stress EF: 66%.  There was no ST segment deviation noted during stress.  No T wave inversion was noted during stress.  The study is normal with no evidence of ischemia or infarct.  This is a low risk study.  The left ventricular ejection fraction is hyperdynamic (>65%).  Laurance Flatten, MD   ECHOCARDIOGRAM  ECHOCARDIOGRAM COMPLETE 08/16/2020  Narrative ECHOCARDIOGRAM REPORT    Patient Name:   Carmen Todd Date of Exam: 08/16/2020 Medical Rec #:  [id number]      Height:       62.5 in Accession #:    [id number]     Weight:       141.0 lb Date of Birth:  09-25-1940      BSA:          1.658 m Patient Age:    79 years       BP:           130/62 mmHg Patient Gender: F              HR:           68 bpm. Exam Location:  Outpatient  Procedure: 2D Echo, Color Doppler and Cardiac Doppler  Indications:    Elevated brain natriuretic peptide (BNP) level [332951] Shortness of breath [786.05.ICD-9-CM]  History:        Patient has no prior history of Echocardiogram examinations. Risk Factors:Hypertension and Dyslipidemia. Thyroid diseasae.  Sonographer:    Leta Jungling RDCS Referring Phys: [phone] CAROL WEBB   Sonographer Comments: Global longitudinal strain was attempted. IMPRESSIONS   1. Left ventricular ejection fraction, by estimation, is 60 to 65%. The left ventricle has normal function. The left ventricle has no regional wall motion abnormalities. Left ventricular diastolic parameters are consistent with Grade II diastolic dysfunction (pseudonormalization). 2. Right ventricular systolic function is normal. The right ventricular size is normal. There is normal pulmonary artery systolic pressure. 3. Left atrial size was moderately dilated. 4. Right atrial size was mildly dilated. 5. The mitral valve is normal in structure. Mild mitral valve regurgitation. 6. The aortic valve is tricuspid. There is mild  calcification of the aortic valve. Aortic valve regurgitation is not visualized. Mild aortic valve sclerosis is present, with no evidence of aortic valve stenosis. 7. The inferior vena cava is normal in size with greater than 50% respiratory variability, suggesting right atrial pressure of 3 mmHg.  FINDINGS Left Ventricle: Left ventricular ejection fraction, by estimation, is 60 to 65%. The left ventricle has normal function. The left ventricle has no regional wall motion abnormalities. The left ventricular internal cavity size was normal in size. There is no left ventricular hypertrophy. Left ventricular diastolic parameters are consistent with Grade II diastolic  dysfunction (pseudonormalization).  Right Ventricle: The right ventricular size is normal. No increase in right ventricular wall thickness. Right ventricular systolic function is normal. There is normal pulmonary artery systolic pressure. The tricuspid regurgitant velocity is 2.76 m/s, and with an assumed right atrial pressure of 3 mmHg, the estimated right ventricular systolic pressure is 33.5 mmHg.  Left Atrium: Left atrial size was moderately dilated.  Right Atrium: Right atrial size was mildly dilated.  Pericardium: There is no evidence of pericardial effusion.  Mitral Valve: The mitral valve is normal in structure. Mild mitral valve regurgitation.  Tricuspid Valve: The tricuspid valve is normal in structure. Tricuspid valve regurgitation is mild.  Aortic Valve: The aortic valve is tricuspid. There is mild calcification of the aortic valve. Aortic valve regurgitation is not visualized. Mild aortic valve sclerosis is present, with no evidence of aortic valve stenosis.  Pulmonic Valve: The pulmonic valve was not well visualized. Pulmonic valve regurgitation is not visualized.  Aorta: The aortic root and ascending aorta are structurally normal, with no evidence of dilitation.  Venous: The inferior vena cava is normal in size with  greater than 50% respiratory variability, suggesting right atrial pressure of 3 mmHg.  IAS/Shunts: No atrial level shunt detected by color flow Doppler.   LEFT VENTRICLE PLAX 2D LVIDd:         4.00 cm  Diastology LVIDs:         3.00 cm  LV e' medial:    4.20 cm/s LV PW:         0.90 cm  LV E/e' medial:  18.3 LV IVS:        0.90 cm  LV e' lateral:   6.30 cm/s LVOT diam:     1.55 cm  LV E/e' lateral: 12.2 LV SV:         38 LV SV Index:   23 LVOT Area:     1.89 cm   RIGHT VENTRICLE RV S prime:     16.30 cm/s TAPSE (M-mode): 2.2 cm  LEFT ATRIUM             Index       RIGHT ATRIUM           Index LA diam:        3.90 cm 2.35 cm/m  RA Area:     10.90 cm LA Vol (A2C):   43.4 ml 26.18 ml/m RA Volume:   20.20 ml  12.19 ml/m LA Vol (A4C):   53.7 ml 32.40 ml/m LA Biplane Vol: 49.6 ml 29.92 ml/m AORTIC VALVE LVOT Vmax:   92.70 cm/s LVOT Vmean:  66.100 cm/s LVOT VTI:    0.204 m  AORTA Ao Root diam: 2.60 cm Ao Asc diam:  2.90 cm  MITRAL VALVE               TRICUSPID VALVE MV Area (PHT): 4.39 cm    TR Peak grad:   30.5 mmHg MV Decel Time: 173 msec    TR Vmax:        276.00 cm/s MV E velocity: 76.70 cm/s MV A velocity: 68.60 cm/s  SHUNTS MV E/A ratio:  1.12        Systemic VTI:  0.20 m Systemic Diam: 1.55 cm  Arvilla Meres MD Electronically signed by Arvilla Meres MD Signature Date/Time: 08/16/2020/4:59:27 PM    Final              Recent Labs: No results found for requested labs within last 365 days.  Recent Lipid Panel No results found for: "CHOL", "TRIG", "HDL", "CHOLHDL", "VLDL", "LDLCALC", "LDLDIRECT"  Physical Exam:    VS:  BP 118/60   Pulse (!) 58   Ht 5' (1.524 m)   Wt 125 lb (56.7 kg)   SpO2 97%   BMI 24.41 kg/m     Wt Readings from Last 3 Encounters:  04/22/23 125 lb (56.7 kg)  04/09/22 121 lb 6.4 oz (55.1 kg)  12/24/20 124 lb (56.2 kg)    Gen: No distress  Neck: No JVD, no carotid bruit Cardiac: No Rubs or Gallops, no murmur, regular  bradycardia, +2 radial pulses Respiratory: Clear to auscultation bilaterally, normal effort, normal  respiratory rate GI: Soft, nontender, distended abdomen MS: No  edema;  moves all extremities Integument: Skin feels warm Neuro:  At time of evaluation, alert and oriented to person/place/time/situation  Psych: Normal affect, patient feels well    ASSESSMENT:    1. Chronic heart failure with preserved ejection fraction (HCC)   2. DOE (dyspnea on exertion)   3. Nonrheumatic mitral valve regurgitation   4. Bilateral carotid artery stenosis   5. Mixed hyperlipidemia     PLAN:     Heart Failure Preserved Ejection Fraction  HTN, Mild MR in 2021 - NYHA II, Stage B - continue olmesartan, norvasc, and atenolol laregly for BP control - BNP today, if mildly elevated lasix 20 mg PO PRN and have take the first two days she receives - if severely elevated, will start lasix and get labs in f/u , may change BB to MRA   Mild CAS HLD - repeat carotid duplex in 2024 - continue current therapy   Scott or Tessa f/u in October/November   Medication Adjustments/Labs and Tests Ordered: Current medicines are reviewed at length with the patient today.  Concerns regarding medicines are outlined above.  Orders Placed This Encounter  Procedures   Pro b natriuretic peptide (BNP)   EKG 12-Lead   ECHOCARDIOGRAM COMPLETE   No orders of the defined types were placed in this encounter.   Patient Instructions  Medication Instructions:  Your physician recommends that you continue on your current medications as directed. Please refer to the Current Medication list given to you today.  *If you need a refill on your cardiac medications before your next appointment, please call your pharmacy*   Lab Work: BNP  If you have labs (blood work) drawn today and your tests are completely normal, you will receive your results only by: MyChart Message (if you have MyChart) OR A paper copy in the mail If  you have any lab test that is abnormal or we need to change your treatment, we will call you to review the results.   Testing/Procedures: Your physician has requested that you have an echocardiogram. Echocardiography is a painless test that uses sound waves to create images of your heart. It provides your doctor with information about the size and shape of your heart and how well your heart's chambers and valves are working. This procedure takes approximately one hour. There are no restrictions for this procedure. Please do NOT wear cologne, perfume, aftershave, or lotions (deodorant is allowed). Please arrive 15 minutes prior to your appointment time.    Follow-Up: At [facility], you and your health needs are our priority.  As part of our continuing mission to provide you with exceptional heart care, we have created designated Provider Care Teams.  These Care Teams include your primary Cardiologist (physician) and Advanced Practice Providers (APPs -  Physician Assistants and Nurse Practitioners) who all work together to provide you with the care you need, when you need it.  Your next appointment:   3 month(s)  Provider:   Jari Favre, PA-C or Tereso Newcomer, PA-C        Signed, Christell Constant, MD  04/22/2023 9:42 AM    [facility]

## 2023-04-22 ENCOUNTER — Encounter: Payer: Self-pay | Admitting: Internal Medicine

## 2023-04-22 ENCOUNTER — Ambulatory Visit: Payer: Medicare Other | Attending: Internal Medicine | Admitting: Internal Medicine

## 2023-04-22 VITALS — BP 118/60 | HR 58 | Ht 60.0 in | Wt 125.0 lb

## 2023-04-22 DIAGNOSIS — I5032 Chronic diastolic (congestive) heart failure: Secondary | ICD-10-CM | POA: Diagnosis not present

## 2023-04-22 DIAGNOSIS — I340 Nonrheumatic mitral (valve) insufficiency: Secondary | ICD-10-CM | POA: Insufficient documentation

## 2023-04-22 DIAGNOSIS — R0609 Other forms of dyspnea: Secondary | ICD-10-CM | POA: Insufficient documentation

## 2023-04-22 DIAGNOSIS — I6523 Occlusion and stenosis of bilateral carotid arteries: Secondary | ICD-10-CM | POA: Insufficient documentation

## 2023-04-22 DIAGNOSIS — E782 Mixed hyperlipidemia: Secondary | ICD-10-CM | POA: Insufficient documentation

## 2023-04-22 NOTE — Patient Instructions (Signed)
Medication Instructions:  Your physician recommends that you continue on your current medications as directed. Please refer to the Current Medication list given to you today.  *If you need a refill on your cardiac medications before your next appointment, please call your pharmacy*   Lab Work: BNP  If you have labs (blood work) drawn today and your tests are completely normal, you will receive your results only by: MyChart Message (if you have MyChart) OR A paper copy in the mail If you have any lab test that is abnormal or we need to change your treatment, we will call you to review the results.   Testing/Procedures: Your physician has requested that you have an echocardiogram. Echocardiography is a painless test that uses sound waves to create images of your heart. It provides your doctor with information about the size and shape of your heart and how well your heart's chambers and valves are working. This procedure takes approximately one hour. There are no restrictions for this procedure. Please do NOT wear cologne, perfume, aftershave, or lotions (deodorant is allowed). Please arrive 15 minutes prior to your appointment time.    Follow-Up: At [facility], you and your health needs are our priority.  As part of our continuing mission to provide you with exceptional heart care, we have created designated Provider Care Teams.  These Care Teams include your primary Cardiologist (physician) and Advanced Practice Providers (APPs -  Physician Assistants and Nurse Practitioners) who all work together to provide you with the care you need, when you need it.  Your next appointment:   3 month(s)  Provider:   Jari Favre, PA-C or Tereso Newcomer, PA-C

## 2023-04-23 ENCOUNTER — Telehealth: Payer: Self-pay

## 2023-04-23 LAB — PRO B NATRIURETIC PEPTIDE: NT-Pro BNP: 678 pg/mL (ref 0–738)

## 2023-04-23 MED ORDER — FUROSEMIDE 20 MG PO TABS: ORAL_TABLET | ORAL | 11 refills | Status: DC

## 2023-04-23 NOTE — Telephone Encounter (Signed)
The patient has been notified of the result and verbalized understanding.  All questions (if any) were answered. Macie Burows, RN 04/23/2023 5:58 PM

## 2023-04-23 NOTE — Telephone Encounter (Signed)
-----   Message from Christell Constant sent at 04/23/2023  7:52 AM EDT ----- Results: NT-ProBNP normal for age  Plan: Lasix 20 mg PO PRN and have take the first two days she receives  Christell Constant, MD

## 2023-05-07 ENCOUNTER — Ambulatory Visit (HOSPITAL_COMMUNITY): Payer: Medicare Other | Attending: Cardiology

## 2023-05-07 DIAGNOSIS — R0609 Other forms of dyspnea: Secondary | ICD-10-CM | POA: Diagnosis not present

## 2023-05-07 DIAGNOSIS — I340 Nonrheumatic mitral (valve) insufficiency: Secondary | ICD-10-CM

## 2023-05-07 DIAGNOSIS — I5032 Chronic diastolic (congestive) heart failure: Secondary | ICD-10-CM | POA: Diagnosis not present

## 2023-05-07 LAB — ECHOCARDIOGRAM COMPLETE
Area-P 1/2: 4.86 cm2
S' Lateral: 1.9 cm

## 2023-05-18 DIAGNOSIS — Z23 Encounter for immunization: Secondary | ICD-10-CM | POA: Diagnosis not present

## 2023-07-20 NOTE — Progress Notes (Unsigned)
Cardiology Office Note:  .   Date:  07/20/2023  ID:  Carmen Todd, DOB 03-Mar-1941, MRN [id number] PCP: Carmen Has, MD  [facility] Providers Cardiologist:  Christell Constant, MD {  History of Present Illness: .   Carmen Todd is a 82 y.o. female with a past medical history of hypertension, hyperlipidemia, mild carotid artery disease with new HFpEF who presents for initial evaluation 09/17/2020.  Patient had a negative stress test.  She was doing well without any shortness of breath.  She was then seen in 2023 and went back to [place] and worked on decreasing sugar intake.  Was seen in August by Dr. Rutherford Limerick and was doing well at that time.  She had visited the [employer].  Sister unfortunately died of dementia.  No hospital or or interval hospital visits.  EKG showed sinus bradycardia.  No chest pain or pressure.  Felt more fatigue and DOE.  Always feels cold.  PCP Todd been working on thyroid management.  No SOB or PND/orthopnea.  She had noted weight gain and leg swelling.  No palpitations or syncope.  Today, she***  ROS: Pertinent ROS in HPI  Studies Reviewed: .       Echo 05/07/2023  IMPRESSIONS     1. Left ventricular ejection fraction, by estimation, is 60 to 65%. The  left ventricle Todd normal function. The left ventricle Todd no regional  wall motion abnormalities. There is mild left ventricular hypertrophy of  the basal-septal segment. Left  ventricular diastolic parameters were normal. The average left ventricular  global longitudinal strain is -17.4 %. The global longitudinal strain is  normal.   2. Right ventricular systolic function is normal. The right ventricular  size is normal.   3. Left atrial size was mildly dilated.   4. The mitral valve is normal in structure. Mild mitral valve  regurgitation. No evidence of mitral stenosis.   5. The aortic valve is tricuspid. Aortic valve regurgitation is not  visualized. No aortic stenosis is  present.   6. The inferior vena cava is normal in size with greater than 50%  respiratory variability, suggesting right atrial pressure of 3 mmHg.   FINDINGS   Left Ventricle: Left ventricular ejection fraction, by estimation, is 60  to 65%. The left ventricle Todd normal function. The left ventricle Todd no  regional wall motion abnormalities. The average left ventricular global  longitudinal strain is -17.4 %.  The global longitudinal strain is normal. The left ventricular internal  cavity size was normal in size. There is mild left ventricular hypertrophy  of the basal-septal segment. Left ventricular diastolic parameters were  normal.   Right Ventricle: The right ventricular size is normal. Right ventricular  systolic function is normal.   Left Atrium: Left atrial size was mildly dilated.   Right Atrium: Right atrial size was normal in size.   Pericardium: There is no evidence of pericardial effusion.   Mitral Valve: The mitral valve is normal in structure. Mild mitral valve  regurgitation. No evidence of mitral valve stenosis.   Tricuspid Valve: The tricuspid valve is normal in structure. Tricuspid  valve regurgitation is mild . No evidence of tricuspid stenosis.   Aortic Valve: The aortic valve is tricuspid. Aortic valve regurgitation is  not visualized. No aortic stenosis is present.   Pulmonic Valve: The pulmonic valve was normal in structure. Pulmonic valve  regurgitation is trivial. No evidence of pulmonic stenosis.   Aorta: The aortic root is normal  in size and structure.   Venous: The inferior vena cava is normal in size with greater than 50%  respiratory variability, suggesting right atrial pressure of 3 mmHg.   IAS/Shunts: No atrial level shunt detected by color flow Doppler.  Risk Assessment/Calculations:   {Does this patient have ATRIAL FIBRILLATION?:[phone]} No BP recorded.  {Refresh Note OR Click here to enter BP  :1}***       Physical Exam:   VS:   There were no vitals taken for this visit.   Wt Readings from Last 3 Encounters:  04/22/23 125 lb (56.7 kg)  04/09/22 121 lb 6.4 oz (55.1 kg)  12/24/20 124 lb (56.2 kg)    GEN: Well nourished, well developed in no acute distress NECK: No JVD; No carotid bruits CARDIAC: ***RRR, no murmurs, rubs, gallops RESPIRATORY:  Clear to auscultation without rales, wheezing or rhonchi  ABDOMEN: Soft, non-tender, non-distended EXTREMITIES:  No edema; No deformity   ASSESSMENT AND PLAN: .   Chronic heart failure with preserved ejection fraction DOE Nonrheumatic mitral valve regurgitation Bilateral carotid artery stenosis Hyperlipidemia    {Are you ordering a CV Procedure (e.g. stress test, cath, DCCV, TEE, etc)?   Press F2        :[id number]}  Dispo: ***  Signed, Sharlene Dory, PA-C

## 2023-07-21 ENCOUNTER — Encounter: Payer: Self-pay | Admitting: Physician Assistant

## 2023-07-21 ENCOUNTER — Ambulatory Visit: Payer: Medicare Other | Attending: Physician Assistant | Admitting: Physician Assistant

## 2023-07-21 VITALS — BP 138/60 | HR 60 | Ht 60.0 in | Wt 125.8 lb

## 2023-07-21 DIAGNOSIS — I340 Nonrheumatic mitral (valve) insufficiency: Secondary | ICD-10-CM | POA: Insufficient documentation

## 2023-07-21 DIAGNOSIS — I10 Essential (primary) hypertension: Secondary | ICD-10-CM | POA: Diagnosis not present

## 2023-07-21 DIAGNOSIS — I6523 Occlusion and stenosis of bilateral carotid arteries: Secondary | ICD-10-CM | POA: Insufficient documentation

## 2023-07-21 DIAGNOSIS — E782 Mixed hyperlipidemia: Secondary | ICD-10-CM | POA: Diagnosis not present

## 2023-07-21 DIAGNOSIS — R0609 Other forms of dyspnea: Secondary | ICD-10-CM | POA: Diagnosis not present

## 2023-07-21 DIAGNOSIS — I5032 Chronic diastolic (congestive) heart failure: Secondary | ICD-10-CM | POA: Diagnosis not present

## 2023-07-21 MED ORDER — ATENOLOL 25 MG PO TABS: 25.0000 mg | ORAL_TABLET | Freq: Every day | ORAL | 3 refills | Status: DC

## 2023-07-21 MED ORDER — OLMESARTAN MEDOXOMIL 20 MG PO TABS: 20.0000 mg | ORAL_TABLET | Freq: Every day | ORAL | 3 refills | Status: DC

## 2023-07-21 MED ORDER — FUROSEMIDE 20 MG PO TABS: ORAL_TABLET | ORAL | 3 refills | Status: AC

## 2023-07-21 MED ORDER — ATORVASTATIN CALCIUM 40 MG PO TABS: 40.0000 mg | ORAL_TABLET | Freq: Every day | ORAL | 3 refills | Status: DC

## 2023-07-21 MED ORDER — AMLODIPINE BESYLATE 5 MG PO TABS: 5.0000 mg | ORAL_TABLET | Freq: Every day | ORAL | 3 refills | Status: AC

## 2023-07-21 NOTE — Patient Instructions (Signed)
Medication Instructions:  Your physician recommends that you continue on your current medications as directed. Please refer to the Current Medication list given to you today.  *If you need a refill on your cardiac medications before your next appointment, please call your pharmacy*   Lab Work: None ordered  If you have labs (blood work) drawn today and your tests are completely normal, you will receive your results only by: [place] (if you have MyChart) OR A paper copy in the mail If you have any lab test that is abnormal or we need to change your treatment, we will call you to review the results.   Testing/Procedures: None ordered   Follow-Up: At [facility], you and your health needs are our priority.  As part of our continuing mission to provide you with exceptional heart care, we have created designated Provider Care Teams.  These Care Teams include your primary Cardiologist (physician) and Advanced Practice Providers (APPs -  Physician Assistants and Nurse Practitioners) who all work together to provide you with the care you need, when you need it.  We recommend signing up for the patient portal called "MyChart".  Sign up information is provided on this After Visit Summary.  MyChart is used to connect with patients for Virtual Visits (Telemedicine).  Patients are able to view lab/test results, encounter notes, upcoming appointments, etc.  Non-urgent messages can be sent to your provider as well.   To learn more about what you can do with MyChart, go to [email or web address].    Your next appointment:   6 month(s)  Provider:   Werner Lean, MD     Other Instructions

## 2023-07-30 DIAGNOSIS — E039 Hypothyroidism, unspecified: Secondary | ICD-10-CM | POA: Diagnosis not present

## 2023-07-30 DIAGNOSIS — K219 Gastro-esophageal reflux disease without esophagitis: Secondary | ICD-10-CM | POA: Diagnosis not present

## 2023-07-30 DIAGNOSIS — I6529 Occlusion and stenosis of unspecified carotid artery: Secondary | ICD-10-CM | POA: Diagnosis not present

## 2023-07-30 DIAGNOSIS — R7303 Prediabetes: Secondary | ICD-10-CM | POA: Diagnosis not present

## 2023-07-30 DIAGNOSIS — Z5181 Encounter for therapeutic drug level monitoring: Secondary | ICD-10-CM | POA: Diagnosis not present

## 2023-07-30 DIAGNOSIS — E785 Hyperlipidemia, unspecified: Secondary | ICD-10-CM | POA: Diagnosis not present

## 2023-07-30 DIAGNOSIS — H409 Unspecified glaucoma: Secondary | ICD-10-CM | POA: Diagnosis not present

## 2023-07-30 DIAGNOSIS — I10 Essential (primary) hypertension: Secondary | ICD-10-CM | POA: Diagnosis not present

## 2023-07-30 DIAGNOSIS — H3530 Unspecified macular degeneration: Secondary | ICD-10-CM | POA: Diagnosis not present

## 2023-09-21 DIAGNOSIS — I10 Essential (primary) hypertension: Secondary | ICD-10-CM | POA: Diagnosis not present

## 2023-09-21 DIAGNOSIS — J40 Bronchitis, not specified as acute or chronic: Secondary | ICD-10-CM | POA: Diagnosis not present

## 2023-09-21 DIAGNOSIS — I5032 Chronic diastolic (congestive) heart failure: Secondary | ICD-10-CM | POA: Diagnosis not present

## 2023-10-12 DIAGNOSIS — H401132 Primary open-angle glaucoma, bilateral, moderate stage: Secondary | ICD-10-CM | POA: Diagnosis not present

## 2024-01-18 ENCOUNTER — Ambulatory Visit: Payer: Medicare Other | Attending: Internal Medicine | Admitting: Internal Medicine

## 2024-01-18 VITALS — BP 146/68 | HR 60 | Ht 60.0 in | Wt 122.8 lb

## 2024-01-18 DIAGNOSIS — I340 Nonrheumatic mitral (valve) insufficiency: Secondary | ICD-10-CM | POA: Insufficient documentation

## 2024-01-18 DIAGNOSIS — I10 Essential (primary) hypertension: Secondary | ICD-10-CM | POA: Diagnosis not present

## 2024-01-18 DIAGNOSIS — E782 Mixed hyperlipidemia: Secondary | ICD-10-CM | POA: Diagnosis not present

## 2024-01-18 NOTE — Patient Instructions (Signed)
Medication Instructions:  Your physician recommends that you continue on your current medications as directed. Please refer to the Current Medication list given to you today.  *If you need a refill on your cardiac medications before your next appointment, please call your pharmacy*   Lab Work: NONE If you have labs (blood work) drawn today and your tests are completely normal, you will receive your results only by: MyChart Message (if you have MyChart) OR A paper copy in the mail If you have any lab test that is abnormal or we need to change your treatment, we will call you to review the results.   Testing/Procedures: NONE   Follow-Up: At [facility], you and your health needs are our priority.  As part of our continuing mission to provide you with exceptional heart care, we have created designated Provider Care Teams.  These Care Teams include your primary Cardiologist (physician) and Advanced Practice Providers (APPs -  Physician Assistants and Nurse Practitioners) who all work together to provide you with the care you need, when you need it.   Your next appointment:   1 year(s)  Provider:   Riley Lam, MD or Jari Favre, [place]

## 2024-01-18 NOTE — Progress Notes (Signed)
 Cardiology Office Note:  .    Date:  01/18/2024  ID:  Carmen Todd, DOB 12/06/1940, MRN [id number] PCP: Ronna Coho, MD  [facility] Providers Cardiologist:  Jann Melody, MD     CC: Volume follow up  History of Present Illness: .    Carmen Todd is a 83 y.o. female with heart failure with preserved ejection fraction who presents for a follow-up visit.  She has a history of heart failure with preserved ejection fraction, mild carotid artery disease, hypertension, and hyperlipidemia. She takes a diuretic every other day, adjusting the frequency based on her activities, and finds this regimen effective. Her blood pressure is generally well-controlled at home, with occasional readings in the 130s, but she notes a slight elevation today due to nervousness about the new building.  Over the past year, she has developed neuropathy, experiencing unusual sensations in her feet, particularly at night. She cannot describe these sensations as either burning or feeling like ice water. She refuses to take gabapentin and uses THC-free CBD oil instead.  She reflects on her cognitive health, mentioning the passing of her sister, who had severe dementia, two years ago. She states, 'I may not have all my buttons, but I got most of them- and don't push them,' and reports no significant changes in cognitive function.  Discussed the use of AI scribe software for clinical note transcription with the patient, who gave verbal consent to proceed.   Relevant histories: .  Social  2022: Doing well with no SOB after initial presentation; she is from [place] 2023: went back to [place]; worked on decreasing sugar intake 2024: one year anniversary of sisters death; saw Tessa ROS: As per HPI.   Studies Reviewed: .     Cardiac Studies & Procedures   ______________________________________________________________________________________________   STRESS TESTS  MYOCARDIAL PERFUSION  IMAGING 10/03/2020  Narrative  Nuclear stress EF: 66%.  There was no ST segment deviation noted during stress.  No T wave inversion was noted during stress.  The study is normal with no evidence of ischemia or infarct.  This is a low risk study.  The left ventricular ejection fraction is hyperdynamic (>65%).  Riccardo Chamberlain, MD   ECHOCARDIOGRAM  ECHOCARDIOGRAM COMPLETE 05/07/2023  Narrative ECHOCARDIOGRAM REPORT    Patient Name:   Carmen Todd Date of Exam: 05/07/2023 Medical Rec #:  [id number]        Height:       60.0 in Accession #:    [id number]       Weight:       125.0 lb Date of Birth:  1940/10/30        BSA:          1.529 m Patient Age:    82 years         BP:           118/60 mmHg Patient Gender: F                HR:           64 bpm. Exam Location:  [facility]  Procedure: Cardiac Doppler, Color Doppler, 2D Echo, 3D Echo and Strain Analysis  Indications:    R06.09 DOE; I34.0 Nonrheumatic mitral (valve) insufficiency  History:        Patient has prior history of Echocardiogram examinations, most recent 08/16/2020. CHF; Risk Factors:Hypertension, Dyslipidemia and Former Smoker. Thyroid  disease. Bilateral carotid artery stenosis.  Sonographer:    Mylinda Asa RCS Referring Phys:  [id number] Amal Renbarger A Imari Sivertsen  IMPRESSIONS   1. Left ventricular ejection fraction, by estimation, is 60 to 65%. The left ventricle has normal function. The left ventricle has no regional wall motion abnormalities. There is mild left ventricular hypertrophy of the basal-septal segment. Left ventricular diastolic parameters were normal. The average left ventricular global longitudinal strain is -17.4 %. The global longitudinal strain is normal. 2. Right ventricular systolic function is normal. The right ventricular size is normal. 3. Left atrial size was mildly dilated. 4. The mitral valve is normal in structure. Mild mitral valve regurgitation. No evidence of mitral  stenosis. 5. The aortic valve is tricuspid. Aortic valve regurgitation is not visualized. No aortic stenosis is present. 6. The inferior vena cava is normal in size with greater than 50% respiratory variability, suggesting right atrial pressure of 3 mmHg.  FINDINGS Left Ventricle: Left ventricular ejection fraction, by estimation, is 60 to 65%. The left ventricle has normal function. The left ventricle has no regional wall motion abnormalities. The average left ventricular global longitudinal strain is -17.4 %. The global longitudinal strain is normal. The left ventricular internal cavity size was normal in size. There is mild left ventricular hypertrophy of the basal-septal segment. Left ventricular diastolic parameters were normal.  Right Ventricle: The right ventricular size is normal. Right ventricular systolic function is normal.  Left Atrium: Left atrial size was mildly dilated.  Right Atrium: Right atrial size was normal in size.  Pericardium: There is no evidence of pericardial effusion.  Mitral Valve: The mitral valve is normal in structure. Mild mitral valve regurgitation. No evidence of mitral valve stenosis.  Tricuspid Valve: The tricuspid valve is normal in structure. Tricuspid valve regurgitation is mild . No evidence of tricuspid stenosis.  Aortic Valve: The aortic valve is tricuspid. Aortic valve regurgitation is not visualized. No aortic stenosis is present.  Pulmonic Valve: The pulmonic valve was normal in structure. Pulmonic valve regurgitation is trivial. No evidence of pulmonic stenosis.  Aorta: The aortic root is normal in size and structure.  Venous: The inferior vena cava is normal in size with greater than 50% respiratory variability, suggesting right atrial pressure of 3 mmHg.  IAS/Shunts: No atrial level shunt detected by color flow Doppler.   LEFT VENTRICLE PLAX 2D LVIDd:         3.50 cm   Diastology LVIDs:         1.90 cm   LV e' medial:    8.27 cm/s LV  PW:         0.90 cm   LV E/e' medial:  10.8 LV IVS:        1.10 cm   LV e' lateral:   12.80 cm/s LVOT diam:     1.70 cm   LV E/e' lateral: 7.0 LV SV:         50 LV SV Index:   33        2D Longitudinal Strain LVOT Area:     2.27 cm  2D Strain GLS Avg:     -17.4 %  3D Volume EF: 3D EF:        67 % LV EDV:       148 ml LV ESV:       49 ml LV SV:        98 ml  RIGHT VENTRICLE RV Basal diam:  3.30 cm RV S prime:     15.60 cm/s TAPSE (M-mode): 2.5 cm RVSP:  33.2 mmHg  LEFT ATRIUM             Index        RIGHT ATRIUM           Index LA diam:        3.70 cm 2.42 cm/m   RA Pressure: 3.00 mmHg LA Vol (A2C):   42.1 ml 27.54 ml/m  RA Area:     11.70 cm LA Vol (A4C):   53.0 ml 34.67 ml/m  RA Volume:   25.10 ml  16.42 ml/m LA Biplane Vol: 47.6 ml 31.14 ml/m AORTIC VALVE LVOT Vmax:   97.40 cm/s LVOT Vmean:  62.000 cm/s LVOT VTI:    0.219 m  AORTA Ao Root diam: 2.60 cm Ao Asc diam:  3.10 cm  MITRAL VALVE               TRICUSPID VALVE MV Area (PHT): 4.86 cm    TR Peak grad:   30.2 mmHg MV Decel Time: 156 msec    TR Vmax:        275.00 cm/s MV E velocity: 89.60 cm/s  Estimated RAP:  3.00 mmHg MV A velocity: 64.50 cm/s  RVSP:           33.2 mmHg MV E/A ratio:  1.39 SHUNTS Systemic VTI:  0.22 m Systemic Diam: 1.70 cm  Alexandria Angel MD Electronically signed by Alexandria Angel MD Signature Date/Time: 05/07/2023/4:35:25 PM    Final          ______________________________________________________________________________________________      Physical Exam:    VS:  BP (!) 146/68 (BP Location: Left Arm)   Pulse 60   Ht 5' (1.524 m)   Wt 122 lb 12.8 oz (55.7 kg)   SpO2 97%   BMI 23.98 kg/m    Wt Readings from Last 3 Encounters:  01/18/24 122 lb 12.8 oz (55.7 kg)  07/21/23 125 lb 12.8 oz (57.1 kg)  04/22/23 125 lb (56.7 kg)    Gen: no distress  Neck: No JVD Cardiac: No Rubs or Gallops, systolic Murmur, RRR +2 radial pulses Respiratory: Clear to  auscultation bilaterally, normal effort, normal  respiratory rate GI: Soft, nontender, slightly-distended  MS: No  edema; moves all extremities Integument: Skin feels warm Neuro:  At time of evaluation, alert and oriented to person/place/time/situation  Psych: Normal affect, patient feels ij   ASSESSMENT AND PLAN: .    Chronic Heart failure with preserved ejection fraction Heart failure with preserved ejection fraction is well-managed with diuretics every other day. No signs of fluid overload or exacerbation. Echocardiogram last year showed mild mitral regurgitation, not expected to progress significantly. - NYHA I euvolemic - Continue diuretics every other day, adjust based on symptoms - Monitor for fluid retention, adjust diuretics as needed - Check labs if diuretic use increases to daily (BMP, add SGLT2i) - Repeat echocardiogram if significant fluid retention occurs  Mild mitral regurgitation Mild mitral regurgitation is well-managed. Murmur present but not expected to progress significantly. Echocardiogram to be repeated in 2027 unless symptoms suggest earlier evaluation.  Hypertension Hypertension is well-controlled with occasional mild elevations due to situational anxiety. Home blood pressure readings are within acceptable range.  If AMB BP elevates add MRA  Hyperlipidemia Hyperlipidemia is well-managed with satisfactory cholesterol levels.  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 No burning or ice water sensation. She declines gabapentin. No evidence of cardiac amyloidosis. Symptoms monitored for changes.; low threshold  for PYP  Longitudinal care: The evaluation and management services provided today reflect the complexity inherent in caring for this patient, including the ongoing longitudinal relationship and management of multiple chronic conditions and/or the need for care coordination.  The visit required a comprehensive assessment and management plan tailored to the patient's unique needs Time was spent addressing not only the acute concerns but also the broader context of the patient's health, including preventive care, chronic disease management, and care coordination as appropriate.  Complex longitudinal is necessary for conditions including: Heart Failure management working to prevent hospitalization.  Potential nuclear scintigraphy testing.   Gloriann Larger, MD FASE [facility] Cardiologist [facility]  [address] [place], [address] [place], [place] [address] [phone]  10:34 AM

## 2024-01-20 DIAGNOSIS — H401132 Primary open-angle glaucoma, bilateral, moderate stage: Secondary | ICD-10-CM | POA: Diagnosis not present

## 2024-02-11 DIAGNOSIS — R7303 Prediabetes: Secondary | ICD-10-CM | POA: Diagnosis not present

## 2024-02-11 DIAGNOSIS — E039 Hypothyroidism, unspecified: Secondary | ICD-10-CM | POA: Diagnosis not present

## 2024-02-11 DIAGNOSIS — I10 Essential (primary) hypertension: Secondary | ICD-10-CM | POA: Diagnosis not present

## 2024-02-11 DIAGNOSIS — Z0000 Encounter for general adult medical examination without abnormal findings: Secondary | ICD-10-CM | POA: Diagnosis not present

## 2024-02-11 DIAGNOSIS — M8588 Other specified disorders of bone density and structure, other site: Secondary | ICD-10-CM | POA: Diagnosis not present

## 2024-02-11 DIAGNOSIS — I5032 Chronic diastolic (congestive) heart failure: Secondary | ICD-10-CM | POA: Diagnosis not present

## 2024-02-11 DIAGNOSIS — E785 Hyperlipidemia, unspecified: Secondary | ICD-10-CM | POA: Diagnosis not present

## 2024-02-11 DIAGNOSIS — M792 Neuralgia and neuritis, unspecified: Secondary | ICD-10-CM | POA: Diagnosis not present

## 2024-03-10 ENCOUNTER — Other Ambulatory Visit: Payer: Self-pay | Admitting: Family Medicine

## 2024-03-10 DIAGNOSIS — Z1231 Encounter for screening mammogram for malignant neoplasm of breast: Secondary | ICD-10-CM

## 2024-03-24 DIAGNOSIS — H43813 Vitreous degeneration, bilateral: Secondary | ICD-10-CM | POA: Diagnosis not present

## 2024-03-24 DIAGNOSIS — H35371 Puckering of macula, right eye: Secondary | ICD-10-CM | POA: Diagnosis not present

## 2024-03-24 DIAGNOSIS — H353222 Exudative age-related macular degeneration, left eye, with inactive choroidal neovascularization: Secondary | ICD-10-CM | POA: Diagnosis not present

## 2024-03-24 DIAGNOSIS — H353210 Exudative age-related macular degeneration, right eye, stage unspecified: Secondary | ICD-10-CM | POA: Diagnosis not present

## 2024-04-05 DIAGNOSIS — H353211 Exudative age-related macular degeneration, right eye, with active choroidal neovascularization: Secondary | ICD-10-CM | POA: Diagnosis not present

## 2024-04-20 ENCOUNTER — Ambulatory Visit
Admission: RE | Admit: 2024-04-20 | Discharge: 2024-04-20 | Disposition: A | Discharge status: Home / Self Care | Source: Ambulatory Visit | Attending: Family Medicine | Admitting: Family Medicine

## 2024-04-20 DIAGNOSIS — Z1231 Encounter for screening mammogram for malignant neoplasm of breast: Secondary | ICD-10-CM

## 2024-05-03 DIAGNOSIS — H353210 Exudative age-related macular degeneration, right eye, stage unspecified: Secondary | ICD-10-CM | POA: Diagnosis not present

## 2024-05-03 DIAGNOSIS — H35371 Puckering of macula, right eye: Secondary | ICD-10-CM | POA: Diagnosis not present

## 2024-05-03 DIAGNOSIS — H43392 Other vitreous opacities, left eye: Secondary | ICD-10-CM | POA: Diagnosis not present

## 2024-05-03 DIAGNOSIS — H353222 Exudative age-related macular degeneration, left eye, with inactive choroidal neovascularization: Secondary | ICD-10-CM | POA: Diagnosis not present

## 2024-05-03 DIAGNOSIS — H43813 Vitreous degeneration, bilateral: Secondary | ICD-10-CM | POA: Diagnosis not present

## 2024-05-18 DIAGNOSIS — H353211 Exudative age-related macular degeneration, right eye, with active choroidal neovascularization: Secondary | ICD-10-CM | POA: Diagnosis not present

## 2024-05-18 DIAGNOSIS — H401131 Primary open-angle glaucoma, bilateral, mild stage: Secondary | ICD-10-CM | POA: Diagnosis not present

## 2024-05-18 DIAGNOSIS — H353223 Exudative age-related macular degeneration, left eye, with inactive scar: Secondary | ICD-10-CM | POA: Diagnosis not present

## 2024-05-19 DIAGNOSIS — Z23 Encounter for immunization: Secondary | ICD-10-CM | POA: Diagnosis not present

## 2024-06-02 DIAGNOSIS — H353211 Exudative age-related macular degeneration, right eye, with active choroidal neovascularization: Secondary | ICD-10-CM | POA: Diagnosis not present

## 2024-06-02 DIAGNOSIS — H35371 Puckering of macula, right eye: Secondary | ICD-10-CM | POA: Diagnosis not present

## 2024-06-02 DIAGNOSIS — H43813 Vitreous degeneration, bilateral: Secondary | ICD-10-CM | POA: Diagnosis not present

## 2024-06-02 DIAGNOSIS — H353222 Exudative age-related macular degeneration, left eye, with inactive choroidal neovascularization: Secondary | ICD-10-CM | POA: Diagnosis not present

## 2024-06-02 DIAGNOSIS — H43392 Other vitreous opacities, left eye: Secondary | ICD-10-CM | POA: Diagnosis not present

## 2024-06-21 ENCOUNTER — Other Ambulatory Visit: Payer: Self-pay | Admitting: Physician Assistant

## 2024-07-14 DIAGNOSIS — H35371 Puckering of macula, right eye: Secondary | ICD-10-CM | POA: Diagnosis not present

## 2024-07-14 DIAGNOSIS — H43392 Other vitreous opacities, left eye: Secondary | ICD-10-CM | POA: Diagnosis not present

## 2024-07-14 DIAGNOSIS — H43813 Vitreous degeneration, bilateral: Secondary | ICD-10-CM | POA: Diagnosis not present

## 2024-07-14 DIAGNOSIS — H353211 Exudative age-related macular degeneration, right eye, with active choroidal neovascularization: Secondary | ICD-10-CM | POA: Diagnosis not present

## 2024-07-14 DIAGNOSIS — H353222 Exudative age-related macular degeneration, left eye, with inactive choroidal neovascularization: Secondary | ICD-10-CM | POA: Diagnosis not present

## 2024-09-10 ENCOUNTER — Other Ambulatory Visit: Payer: Self-pay | Admitting: Physician Assistant

## 2024-09-12 NOTE — Telephone Encounter (Signed)
 Pt of Dr. Santo. This RX was prescribed by another Provider. Does Dr. Santo want to refill? Please advise.

## 2024-09-16 ENCOUNTER — Other Ambulatory Visit: Payer: Self-pay | Admitting: Physician Assistant
